# Patient Record
Sex: Male | Born: 1975 | Race: White | Hispanic: No | Smoking: Current every day smoker
Health system: Southern US, Community
[De-identification: ages and names within clinical notes are randomized; demographics above are authoritative.]

---

## 2000-12-16 ENCOUNTER — Emergency Department (HOSPITAL_COMMUNITY): Admission: EM | Admit: 2000-12-16 | Discharge: 2000-12-16 | Payer: Self-pay | Admitting: Emergency Medicine

## 2004-11-30 ENCOUNTER — Emergency Department (HOSPITAL_COMMUNITY): Admission: EM | Admit: 2004-11-30 | Discharge: 2004-11-30 | Payer: Self-pay | Admitting: Emergency Medicine

## 2005-02-07 ENCOUNTER — Emergency Department (HOSPITAL_COMMUNITY): Admission: EM | Admit: 2005-02-07 | Discharge: 2005-02-07 | Payer: Self-pay | Admitting: Emergency Medicine

## 2006-04-29 ENCOUNTER — Emergency Department (HOSPITAL_COMMUNITY): Admission: EM | Admit: 2006-04-29 | Discharge: 2006-04-29 | Payer: Self-pay | Admitting: Emergency Medicine

## 2012-05-12 ENCOUNTER — Emergency Department (HOSPITAL_COMMUNITY)
Admission: EM | Admit: 2012-05-12 | Discharge: 2012-05-13 | Disposition: A | Discharge status: Home / Self Care | Payer: Self-pay | Attending: Emergency Medicine | Admitting: Emergency Medicine

## 2012-05-12 ENCOUNTER — Encounter (HOSPITAL_COMMUNITY): Payer: Self-pay | Admitting: *Deleted

## 2012-05-12 ENCOUNTER — Emergency Department (HOSPITAL_COMMUNITY): Payer: Self-pay

## 2012-05-12 DIAGNOSIS — R209 Unspecified disturbances of skin sensation: Secondary | ICD-10-CM | POA: Insufficient documentation

## 2012-05-12 DIAGNOSIS — R509 Fever, unspecified: Secondary | ICD-10-CM | POA: Insufficient documentation

## 2012-05-12 DIAGNOSIS — K5900 Constipation, unspecified: Secondary | ICD-10-CM | POA: Insufficient documentation

## 2012-05-12 DIAGNOSIS — R110 Nausea: Secondary | ICD-10-CM | POA: Insufficient documentation

## 2012-05-12 DIAGNOSIS — F17200 Nicotine dependence, unspecified, uncomplicated: Secondary | ICD-10-CM | POA: Insufficient documentation

## 2012-05-12 LAB — CBC WITH DIFFERENTIAL/PLATELET
Basophils Absolute: 0.1 10*3/uL (ref 0.0–0.1)
Basophils Relative: 1 % (ref 0–1)
Eosinophils Relative: 5 % (ref 0–5)
HCT: 43.3 % (ref 39.0–52.0)
MCHC: 34.6 g/dL (ref 30.0–36.0)
MCV: 86.8 fL (ref 78.0–100.0)
Monocytes Absolute: 0.7 10*3/uL (ref 0.1–1.0)
Neutro Abs: 3.2 10*3/uL (ref 1.7–7.7)
RDW: 12.6 % (ref 11.5–15.5)

## 2012-05-12 MED ORDER — IOHEXOL 300 MG/ML  SOLN
50.0000 mL | Freq: Once | INTRAMUSCULAR | Status: AC | PRN
  Administered 2012-05-12: 50 mL via ORAL

## 2012-05-12 MED ORDER — MORPHINE SULFATE 4 MG/ML IJ SOLN
4.0000 mg | Freq: Once | INTRAMUSCULAR | Status: AC
  Administered 2012-05-13: 4 mg via INTRAVENOUS
  Filled 2012-05-12: qty 1

## 2012-05-12 MED ORDER — ONDANSETRON HCL 4 MG/2ML IJ SOLN
4.0000 mg | Freq: Once | INTRAMUSCULAR | Status: AC
  Administered 2012-05-12: 4 mg via INTRAVENOUS
  Filled 2012-05-12: qty 2

## 2012-05-12 NOTE — ED Notes (Signed)
Pain in right lower quadrant, intermittent numbness left hand

## 2012-05-13 LAB — URINALYSIS, ROUTINE W REFLEX MICROSCOPIC
Glucose, UA: NEGATIVE mg/dL
Hgb urine dipstick: NEGATIVE
Specific Gravity, Urine: 1.015 (ref 1.005–1.030)
Urobilinogen, UA: 0.2 mg/dL (ref 0.0–1.0)

## 2012-05-13 LAB — COMPREHENSIVE METABOLIC PANEL
AST: 24 U/L (ref 0–37)
Albumin: 4.2 g/dL (ref 3.5–5.2)
Calcium: 9.8 mg/dL (ref 8.4–10.5)
Creatinine, Ser: 0.94 mg/dL (ref 0.50–1.35)
Total Protein: 7.3 g/dL (ref 6.0–8.3)

## 2012-05-13 MED ORDER — POLYETHYLENE GLYCOL 3350 17 GM/SCOOP PO POWD: 17.0000 g | Freq: Every day | ORAL | Status: DC

## 2012-05-13 MED ORDER — MAGNESIUM CITRATE PO SOLN: ORAL | Status: DC

## 2012-05-13 MED ORDER — IOHEXOL 300 MG/ML  SOLN
100.0000 mL | Freq: Once | INTRAMUSCULAR | Status: AC | PRN
  Administered 2012-05-13: 100 mL via INTRAVENOUS

## 2012-05-13 NOTE — ED Provider Notes (Signed)
Medical screening examination/treatment/procedure(s) were performed by non-physician practitioner and as supervising physician I was immediately available for consultation/collaboration. \  Benny Lennert, MD 05/13/12 [phone]
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
## 2012-05-13 NOTE — ED Notes (Signed)
Pt alert & oriented x4, stable gait. Patient given discharge instructions, paperwork & prescription(s). Patient  instructed to stop at the registration desk to finish any additional paperwork. Patient verbalized understanding. Pt left department w/ no further questions. 

## 2016-03-23 ENCOUNTER — Ambulatory Visit (INDEPENDENT_AMBULATORY_CARE_PROVIDER_SITE_OTHER): Payer: BLUE CROSS/BLUE SHIELD | Admitting: Family

## 2016-03-23 ENCOUNTER — Encounter: Payer: Self-pay | Admitting: Family

## 2016-03-23 VITALS — BP 122/79 | HR 72 | Temp 97.1°F | Ht 70.0 in | Wt 206.0 lb

## 2016-03-23 DIAGNOSIS — E663 Overweight: Secondary | ICD-10-CM

## 2016-03-23 DIAGNOSIS — F17200 Nicotine dependence, unspecified, uncomplicated: Secondary | ICD-10-CM | POA: Diagnosis not present

## 2016-03-23 DIAGNOSIS — J209 Acute bronchitis, unspecified: Secondary | ICD-10-CM | POA: Diagnosis not present

## 2016-03-23 DIAGNOSIS — J019 Acute sinusitis, unspecified: Secondary | ICD-10-CM

## 2016-03-23 MED ORDER — AMOXICILLIN-POT CLAVULANATE 875-125 MG PO TABS: 1.0000 | ORAL_TABLET | Freq: Two times a day (BID) | ORAL | 0 refills | Status: DC

## 2016-03-23 MED ORDER — PREDNISONE 10 MG (21) PO TBPK: ORAL_TABLET | ORAL | 0 refills | Status: DC

## 2016-03-23 NOTE — Progress Notes (Signed)
Subjective:    Patient ID: Vincent Medina, male    DOB: [birth year], 41 y.o.   MRN: [id number]  Cough  This is a new problem. The current episode started 1 to 4 weeks ago. The problem has been waxing and waning. The problem occurs every few minutes. The cough is productive of sputum and productive of purulent sputum. Associated symptoms include headaches, myalgias, nasal congestion, postnasal drip, rhinorrhea, a sore throat, shortness of breath, sweats and wheezing. Pertinent negatives include no chills, ear congestion, ear pain or fever. The symptoms are aggravated by lying down. Risk factors for lung disease include smoking/tobacco exposure. He has tried rest and OTC cough suppressant for the symptoms. The treatment provided mild relief.  Headache   Associated symptoms include coughing, rhinorrhea and a sore throat. Pertinent negatives include no ear pain or fever.  Diarrhea   Associated symptoms include coughing, headaches, myalgias and sweats. Pertinent negatives include no chills or fever.      Review of Systems  Constitutional: Negative for chills and fever.  HENT: Positive for postnasal drip, rhinorrhea and sore throat. Negative for ear pain.   Respiratory: Positive for cough, shortness of breath and wheezing.   Gastrointestinal: Positive for diarrhea.  Musculoskeletal: Positive for myalgias.  Neurological: Positive for headaches.  All other systems reviewed and are negative.      Objective:   Physical Exam  Constitutional: He is oriented to person, place, and time. He appears well-developed and well-nourished. No distress.  HENT:  Head: Normocephalic.  Right Ear: External ear normal.  Left Ear: External ear normal.  Nose: Mucosal edema and rhinorrhea present. Right sinus exhibits frontal sinus tenderness. Left sinus exhibits frontal sinus tenderness.  Mouth/Throat: Posterior oropharyngeal edema and posterior oropharyngeal erythema present.  Eyes: Pupils are equal, round, and  reactive to light. Right eye exhibits no discharge. Left eye exhibits no discharge.  Neck: Normal range of motion. Neck supple. No thyromegaly present.  Cardiovascular: Normal rate, regular rhythm, normal heart sounds and intact distal pulses.   No murmur heard. Pulmonary/Chest: Effort normal. No respiratory distress. He has wheezes in the right middle field and the left upper field.  Abdominal: Soft. Bowel sounds are normal. He exhibits no distension. There is no tenderness.  Musculoskeletal: Normal range of motion. He exhibits no edema or tenderness.  Neurological: He is alert and oriented to person, place, and time. He has normal reflexes. No cranial nerve deficit.  Skin: Skin is warm and dry. No rash noted. No erythema.  Psychiatric: He has a normal mood and affect. His behavior is normal. Judgment and thought content normal.  Vitals reviewed.     BP 122/79   Pulse 72   Temp 97.1 F (36.2 C) (Oral)   Ht 5\' 10"  (1.778 m)   Wt 206 lb (93.4 kg)   BMI 29.56 kg/m      Assessment & Plan:  1. Acute bronchitis, unspecified organism - Take meds as prescribed - Use a cool mist humidifier  -Use saline nose sprays frequently -Saline irrigations of the nose can be very helpful if done frequently.  * 4X daily for 1 week*  * Use of a nettie pot can be helpful with this. Follow directions with this* -Force fluids -For any cough or congestion  Use plain Mucinex- regular strength or max strength is fine   * Children- consult with Pharmacist for dosing -For fever or aces or pains- take tylenol or ibuprofen appropriate for age and weight.  * for fevers greater  than 101 orally you may alternate ibuprofen and tylenol every  3 hours. -Throat lozenges if help - amoxicillin-clavulanate (AUGMENTIN) 875-125 MG tablet; Take 1 tablet by mouth 2 (two) times daily.  Dispense: 14 tablet; Refill: 0 - predniSONE (STERAPRED UNI-PAK 21 TAB) 10 MG (21) TBPK tablet; Use as directed  Dispense: 21 tablet;  Refill: 0  2. Acute sinusitis, recurrence not specified, unspecified location - amoxicillin-clavulanate (AUGMENTIN) 875-125 MG tablet; Take 1 tablet by mouth 2 (two) times daily.  Dispense: 14 tablet; Refill: 0 - predniSONE (STERAPRED UNI-PAK 21 TAB) 10 MG (21) TBPK tablet; Use as directed  Dispense: 21 tablet; Refill: 0  3. Overweight (BMI 25.0-29.9)  4. Current smoker Smoking cessation discused - amoxicillin-clavulanate (AUGMENTIN) 875-125 MG tablet; Take 1 tablet by mouth 2 (two) times daily.  Dispense: 14 tablet; Refill: 0   Health Maintenance reviewed Diet and exercise encouraged RTO pt encouraged to schedule CPE  Jannifer Rodneyhristy Hawks, FNP

## 2016-03-23 NOTE — Patient Instructions (Signed)

## 2016-04-11 ENCOUNTER — Ambulatory Visit (INDEPENDENT_AMBULATORY_CARE_PROVIDER_SITE_OTHER): Payer: BLUE CROSS/BLUE SHIELD | Admitting: Family

## 2016-04-11 ENCOUNTER — Encounter: Payer: Self-pay | Admitting: Family

## 2016-04-11 VITALS — BP 117/76 | HR 83 | Temp 98.2°F | Ht 70.0 in | Wt 204.2 lb

## 2016-04-11 DIAGNOSIS — J209 Acute bronchitis, unspecified: Secondary | ICD-10-CM

## 2016-04-11 DIAGNOSIS — F17200 Nicotine dependence, unspecified, uncomplicated: Secondary | ICD-10-CM | POA: Diagnosis not present

## 2016-04-11 MED ORDER — ALBUTEROL SULFATE HFA 108 (90 BASE) MCG/ACT IN AERS: 2.0000 | INHALATION_SPRAY | Freq: Four times a day (QID) | RESPIRATORY_TRACT | 2 refills | Status: DC | PRN

## 2016-04-11 MED ORDER — LEVOFLOXACIN 500 MG PO TABS: 500.0000 mg | ORAL_TABLET | Freq: Every day | ORAL | 0 refills | Status: DC

## 2016-04-11 MED ORDER — PREDNISONE 10 MG (21) PO TBPK: ORAL_TABLET | ORAL | 0 refills | Status: DC

## 2016-04-11 NOTE — Patient Instructions (Signed)

## 2016-04-11 NOTE — Progress Notes (Signed)
Subjective:    Patient ID: Vincent Medina, male    DOB: 01-May-1975, 41 y.o.   MRN: [id number]  Cough  This is a recurrent problem. The current episode started 1 to 4 weeks ago. The problem has been waxing and waning. The problem occurs every few minutes. The cough is productive of sputum. Associated symptoms include headaches, nasal congestion, postnasal drip, rhinorrhea, a sore throat, shortness of breath, sweats and wheezing. Pertinent negatives include no chills, ear congestion, ear pain, fever or myalgias. The symptoms are aggravated by lying down. Risk factors for lung disease include smoking/tobacco exposure. He has tried rest and oral steroids (augmentin) for the symptoms. The treatment provided mild relief. There is no history of asthma or COPD.      Review of Systems  Constitutional: Negative for chills and fever.  HENT: Positive for postnasal drip, rhinorrhea and sore throat. Negative for ear pain.   Respiratory: Positive for cough, shortness of breath and wheezing.   Musculoskeletal: Negative for myalgias.  Neurological: Positive for headaches.  All other systems reviewed and are negative.      Objective:   Physical Exam  Constitutional: He is oriented to person, place, and time. He appears well-developed and well-nourished. No distress.  HENT:  Head: Normocephalic.  Right Ear: External ear normal. Tympanic membrane is erythematous.  Left Ear: External ear normal. Tympanic membrane is erythematous.  Nose: Mucosal edema and rhinorrhea present.  Mouth/Throat: Posterior oropharyngeal erythema present.  Eyes: Pupils are equal, round, and reactive to light. Right eye exhibits no discharge. Left eye exhibits no discharge.  Neck: Normal range of motion. Neck supple. No thyromegaly present.  Cardiovascular: Normal rate, regular rhythm, normal heart sounds and intact distal pulses.   No murmur heard. Pulmonary/Chest: Effort normal. No respiratory distress. He has wheezes.    Abdominal: Soft. Bowel sounds are normal. He exhibits no distension. There is no tenderness.  Musculoskeletal: Normal range of motion. He exhibits no edema or tenderness.  Neurological: He is alert and oriented to person, place, and time. He has normal reflexes. No cranial nerve deficit.  Skin: Skin is warm and dry. No rash noted. No erythema.  Psychiatric: He has a normal mood and affect. His behavior is normal. Judgment and thought content normal.  Vitals reviewed.     BP 117/76   Pulse 83   Temp 98.2 F (36.8 C) (Oral)   Ht  (1.778 m)   Wt 204 lb 3.2 oz (92.6 kg)   BMI 29.30 kg/m      Assessment & Plan:  1. Current smoker -Smoking cessation - albuterol (PROVENTIL HFA;VENTOLIN HFA) 108 (90 Base) MCG/ACT inhaler; Inhale 2 puffs into the lungs every 6 (six) hours as needed for wheezing or shortness of breath.  Dispense: 1 Inhaler; Refill: 2  2. Acute bronchitis, unspecified organism - Take meds as prescribed - Use a cool mist humidifier  -Use saline nose sprays frequently -Saline irrigations of the nose can be very helpful if done frequently.  * 4X daily for 1 week*  * Use of a nettie pot can be helpful with this. Follow directions with this* -Force fluids -For any cough or congestion  Use plain Mucinex- regular strength or max strength is fine   * Children- consult with Pharmacist for dosing -For fever or aces or pains- take tylenol or ibuprofen appropriate for age and weight.  * for fevers greater than 101 orally you may alternate ibuprofen and tylenol every  3 hours. -Throat lozenges if help -  levofloxacin (LEVAQUIN) 500 MG tablet; Take 1 tablet (500 mg total) by mouth daily.  Dispense: 7 tablet; Refill: 0 - predniSONE (STERAPRED UNI-PAK 21 TAB) 10 MG (21) TBPK tablet; Use as directed  Dispense: 21 tablet; Refill: 0 - albuterol (PROVENTIL HFA;VENTOLIN HFA) 108 (90 Base) MCG/ACT inhaler; Inhale 2 puffs into the lungs every 6 (six) hours as needed for wheezing or  shortness of breath.  Dispense: 1 Inhaler; Refill: 2   Jannifer Rodney, FNP

## 2016-04-18 ENCOUNTER — Telehealth: Payer: Self-pay | Admitting: Family

## 2016-04-18 MED ORDER — NYSTATIN 100000 UNIT/ML MT SUSP: 5.0000 mL | Freq: Four times a day (QID) | OROMUCOSAL | 0 refills | Status: DC

## 2016-04-18 NOTE — Telephone Encounter (Signed)
Nystatin oral solution Prescription sent to pharmacy

## 2016-04-18 NOTE — Telephone Encounter (Signed)
What symptoms do you have? White stuff on tongue and back of throat, he has been seen by [facility] for bronchitis  How long have you been sick? Since yesterday  Have you been seen for this problem? For bronchitis was prescribed antibiotic and now thinks he has thrush  If your provider decides to give you a prescription, which pharmacy would you like for it to be sent to? [facility]   Patient informed that this information will be sent to the clinical staff for review and that they should receive a follow up call.

## 2016-04-18 NOTE — Telephone Encounter (Signed)
Patient aware that Nystatin has been sent to the pharmacy

## 2016-04-23 ENCOUNTER — Telehealth: Payer: Self-pay | Admitting: Family

## 2016-04-23 NOTE — Telephone Encounter (Signed)
Informed pt that Rx has 2 RFs to check with pharmacy to see if he can get RF, insurance may not pay for one so soon but he maybe able to pay out of pocket

## 2016-04-23 NOTE — Telephone Encounter (Signed)
What is the name of the medication? Inhaler,  Have you contacted your pharmacy to request a refill? No   Which pharmacy would you like this sent to? [facility].   Patient notified that their request is being sent to the clinical staff for review and that they should receive a call once it is complete. If they do not receive a call within 24 hours they can check with their pharmacy or our office.

## 2016-04-30 ENCOUNTER — Telehealth: Payer: Self-pay | Admitting: Family

## 2016-04-30 NOTE — Telephone Encounter (Signed)
Pt will need to be seen. He needs a chest x-ray.

## 2016-04-30 NOTE — Telephone Encounter (Signed)
Patient aware that he needs to be seen and appointment given for 8:10 in the morning.

## 2016-04-30 NOTE — Telephone Encounter (Signed)
Symptoms still include  Really bad cough Congestion - rattling - productive No fever Some nasal drainage  Finished levaquin and prednisone  This was 2nd round of antibiotic = would prefer not to come back in due to cost, but will do whatever you say.

## 2016-05-01 ENCOUNTER — Ambulatory Visit (INDEPENDENT_AMBULATORY_CARE_PROVIDER_SITE_OTHER): Payer: BLUE CROSS/BLUE SHIELD | Admitting: Family

## 2016-05-01 ENCOUNTER — Ambulatory Visit (INDEPENDENT_AMBULATORY_CARE_PROVIDER_SITE_OTHER): Payer: BLUE CROSS/BLUE SHIELD

## 2016-05-01 ENCOUNTER — Encounter: Payer: Self-pay | Admitting: Family

## 2016-05-01 VITALS — BP 132/87 | HR 66 | Temp 97.2°F | Ht 70.0 in | Wt 213.0 lb

## 2016-05-01 DIAGNOSIS — F17200 Nicotine dependence, unspecified, uncomplicated: Secondary | ICD-10-CM | POA: Diagnosis not present

## 2016-05-01 DIAGNOSIS — J209 Acute bronchitis, unspecified: Secondary | ICD-10-CM | POA: Diagnosis not present

## 2016-05-01 DIAGNOSIS — R05 Cough: Secondary | ICD-10-CM | POA: Diagnosis not present

## 2016-05-01 DIAGNOSIS — R062 Wheezing: Secondary | ICD-10-CM

## 2016-05-01 DIAGNOSIS — R059 Cough, unspecified: Secondary | ICD-10-CM

## 2016-05-01 MED ORDER — ALBUTEROL SULFATE (2.5 MG/3ML) 0.083% IN NEBU: 2.5000 mg | INHALATION_SOLUTION | Freq: Four times a day (QID) | RESPIRATORY_TRACT | 1 refills | Status: DC | PRN

## 2016-05-01 MED ORDER — PREDNISONE 20 MG PO TABS: ORAL_TABLET | ORAL | 0 refills | Status: DC

## 2016-05-01 MED ORDER — LEVOFLOXACIN 500 MG PO TABS: 500.0000 mg | ORAL_TABLET | Freq: Every day | ORAL | 0 refills | Status: DC

## 2016-05-01 NOTE — Progress Notes (Signed)
Subjective:    Patient ID: Vincent Medina, male    DOB: 02/23/1975, 41 y.o.   MRN: [id number]  Cough  The current episode started 1 to 4 weeks ago. The problem has been waxing and waning. The problem occurs every few minutes. The cough is productive of sputum. Associated symptoms include chills, a fever, headaches, postnasal drip, rhinorrhea, shortness of breath and wheezing. Pertinent negatives include no ear congestion, ear pain or nasal congestion. Risk factors for lung disease include smoking/tobacco exposure. He has tried rest and OTC cough suppressant (antibiotics ) for the symptoms. The treatment provided moderate relief. There is no history of asthma or COPD.  Headache   Associated symptoms include coughing, dizziness, a fever and rhinorrhea. Pertinent negatives include no ear pain.  Dizziness  Associated symptoms include chills, coughing, a fever and headaches.      Review of Systems  Constitutional: Positive for chills and fever.  HENT: Positive for postnasal drip and rhinorrhea. Negative for ear pain.   Respiratory: Positive for cough, shortness of breath and wheezing.   Neurological: Positive for dizziness and headaches.  All other systems reviewed and are negative.      Objective:   Physical Exam  Constitutional: He is oriented to person, place, and time. He appears well-developed and well-nourished. No distress.  HENT:  Head: Normocephalic.  Right Ear: External ear normal. Tympanic membrane is erythematous.  Left Ear: External ear normal. Tympanic membrane is erythematous.  Nose: Mucosal edema and rhinorrhea present.  Mouth/Throat: Posterior oropharyngeal erythema present.  Eyes: Pupils are equal, round, and reactive to light. Right eye exhibits no discharge. Left eye exhibits no discharge.  Neck: Normal range of motion. Neck supple. No thyromegaly present.  Cardiovascular: Normal rate, regular rhythm, normal heart sounds and intact distal pulses.   No murmur  heard. Pulmonary/Chest: Effort normal. No respiratory distress. He has wheezes.  Abdominal: Soft. Bowel sounds are normal. He exhibits no distension. There is no tenderness.  Musculoskeletal: Normal range of motion. He exhibits no edema or tenderness.  Neurological: He is alert and oriented to person, place, and time. He has normal reflexes. No cranial nerve deficit.  Skin: Skin is warm and dry. No rash noted. No erythema.  Psychiatric: He has a normal mood and affect. His behavior is normal. Judgment and thought content normal.  Vitals reviewed.   X-ray- negative Preliminary reading by Jannifer Rodney, FNP [facility]   BP 132/87   Pulse 66   Temp 97.2 F (36.2 C) (Oral)   Ht  (1.778 m)   Wt 213 lb (96.6 kg)   BMI 30.56 kg/m      Assessment & Plan:  1. Cough - DG Chest 2 View; Future  2. Acute bronchitis, unspecified organism - Take meds as prescribed - Use a cool mist humidifier  -Use saline nose sprays frequently -Saline irrigations of the nose can be very helpful if done frequently.  * 4X daily for 1 week*  * Use of a nettie pot can be helpful with this. Follow directions with this* -Force fluids -For any cough or congestion  Use plain Mucinex- regular strength or max strength is fine   * Children- consult with Pharmacist for dosing -For fever or aces or pains- take tylenol or ibuprofen appropriate for age and weight.  * for fevers greater than 101 orally you may alternate ibuprofen and tylenol every  3 hours. -Throat lozenges if help - predniSONE (DELTASONE) 20 MG tablet; Take 3 tabs daily for 1 week,  then 2 tabs for 1 week, then 1 tab for one week  Dispense: 42 tablet; Refill: 0 - albuterol (PROVENTIL) (2.5 MG/3ML) 0.083% nebulizer solution; Take 3 mLs (2.5 mg total) by nebulization every 6 (six) hours as needed for wheezing or shortness of breath.  Dispense: 150 mL; Refill: 1 - DME Nebulizer machine - levofloxacin (LEVAQUIN) 500 MG tablet; Take 1 tablet (500 mg total)  by mouth daily.  Dispense: 7 tablet; Refill: 0  3. Wheezing - albuterol (PROVENTIL) (2.5 MG/3ML) 0.083% nebulizer solution; Take 3 mLs (2.5 mg total) by nebulization every 6 (six) hours as needed for wheezing or shortness of breath.  Dispense: 150 mL; Refill: 1 - DME Nebulizer machine  4. Smoker -Smoking cessation discussed   Jannifer Rodney, FNP

## 2016-05-01 NOTE — Patient Instructions (Signed)

## 2016-07-16 DIAGNOSIS — S15101A Unspecified injury of right vertebral artery, initial encounter: Secondary | ICD-10-CM | POA: Insufficient documentation

## 2016-07-16 DIAGNOSIS — I639 Cerebral infarction, unspecified: Secondary | ICD-10-CM

## 2016-07-16 DIAGNOSIS — S32039A Unspecified fracture of third lumbar vertebra, initial encounter for closed fracture: Secondary | ICD-10-CM | POA: Insufficient documentation

## 2016-07-16 DIAGNOSIS — S32049A Unspecified fracture of fourth lumbar vertebra, initial encounter for closed fracture: Secondary | ICD-10-CM | POA: Insufficient documentation

## 2016-07-16 DIAGNOSIS — S0300XD Dislocation of jaw, unspecified side, subsequent encounter: Secondary | ICD-10-CM | POA: Insufficient documentation

## 2016-07-16 DIAGNOSIS — S42001A Fracture of unspecified part of right clavicle, initial encounter for closed fracture: Secondary | ICD-10-CM | POA: Insufficient documentation

## 2016-07-16 HISTORY — DX: Cerebral infarction, unspecified: I63.9

## 2016-08-03 ENCOUNTER — Other Ambulatory Visit (HOSPITAL_COMMUNITY): Payer: Self-pay

## 2016-08-03 ENCOUNTER — Inpatient Hospital Stay
Admission: AD | Admit: 2016-08-03 | Discharge: 2016-08-15 | Disposition: A | Discharge status: Skilled Nursing Facility | Payer: Self-pay | Source: Ambulatory Visit | Attending: Internal Medicine | Admitting: Internal Medicine

## 2016-08-03 DIAGNOSIS — Z0189 Encounter for other specified special examinations: Secondary | ICD-10-CM

## 2016-08-04 LAB — CBC WITH DIFFERENTIAL/PLATELET
BASOS PCT: 1 %
Basophils Absolute: 0 10*3/uL (ref 0.0–0.1)
EOS ABS: 0.2 10*3/uL (ref 0.0–0.7)
EOS PCT: 2 %
HCT: 37.5 % — ABNORMAL LOW (ref 39.0–52.0)
Hemoglobin: 12 g/dL — ABNORMAL LOW (ref 13.0–17.0)
LYMPHS PCT: 20 %
Lymphs Abs: 1.6 10*3/uL (ref 0.7–4.0)
MCH: 30 pg (ref 26.0–34.0)
MCHC: 32 g/dL (ref 30.0–36.0)
MCV: 93.8 fL (ref 78.0–100.0)
MONO ABS: 0.6 10*3/uL (ref 0.1–1.0)
Monocytes Relative: 7 %
NEUTROS ABS: 5.8 10*3/uL (ref 1.7–7.7)
Neutrophils Relative %: 70 %
PLATELETS: 445 10*3/uL — AB (ref 150–400)
RBC: 4 MIL/uL — ABNORMAL LOW (ref 4.22–5.81)
RDW: 13.6 % (ref 11.5–15.5)
WBC: 8.2 10*3/uL (ref 4.0–10.5)

## 2016-08-04 LAB — COMPREHENSIVE METABOLIC PANEL
ALBUMIN: 3.4 g/dL — AB (ref 3.5–5.0)
ALT: 154 U/L — ABNORMAL HIGH (ref 17–63)
ANION GAP: 10 (ref 5–15)
AST: 59 U/L — AB (ref 15–41)
Alkaline Phosphatase: 132 U/L — ABNORMAL HIGH (ref 38–126)
BUN: 21 mg/dL — ABNORMAL HIGH (ref 6–20)
CHLORIDE: 108 mmol/L (ref 101–111)
CO2: 26 mmol/L (ref 22–32)
Calcium: 9.4 mg/dL (ref 8.9–10.3)
Creatinine, Ser: 0.95 mg/dL (ref 0.61–1.24)
GFR calc Af Amer: >60 mL/min (ref >60)
GFR calc non Af Amer: >60 mL/min (ref >60)
GLUCOSE: 101 mg/dL — AB (ref 65–99)
POTASSIUM: 3.7 mmol/L (ref 3.5–5.1)
SODIUM: 144 mmol/L (ref 135–145)
Total Bilirubin: 0.7 mg/dL (ref 0.3–1.2)
Total Protein: 6.8 g/dL (ref 6.5–8.1)

## 2016-08-08 LAB — CBC WITH DIFFERENTIAL/PLATELET
BASOS ABS: 0 10*3/uL (ref 0.0–0.1)
BASOS PCT: 1 %
Eosinophils Absolute: 0.2 10*3/uL (ref 0.0–0.7)
Eosinophils Relative: 3 %
HEMATOCRIT: 42.1 % (ref 39.0–52.0)
Hemoglobin: 13.7 g/dL (ref 13.0–17.0)
LYMPHS ABS: 1.8 10*3/uL (ref 0.7–4.0)
LYMPHS PCT: 27 %
MCH: 29.9 pg (ref 26.0–34.0)
MCHC: 32.5 g/dL (ref 30.0–36.0)
MCV: 91.9 fL (ref 78.0–100.0)
MONOS PCT: 9 %
Monocytes Absolute: 0.6 10*3/uL (ref 0.1–1.0)
NEUTROS PCT: 60 %
Neutro Abs: 3.9 10*3/uL (ref 1.7–7.7)
PLATELETS: 354 10*3/uL (ref 150–400)
RBC: 4.58 MIL/uL (ref 4.22–5.81)
RDW: 13.6 % (ref 11.5–15.5)
WBC: 6.5 10*3/uL (ref 4.0–10.5)

## 2016-08-08 LAB — BASIC METABOLIC PANEL
ANION GAP: 10 (ref 5–15)
BUN: 14 mg/dL (ref 6–20)
CALCIUM: 9.3 mg/dL (ref 8.9–10.3)
CO2: 26 mmol/L (ref 22–32)
Chloride: 104 mmol/L (ref 101–111)
Creatinine, Ser: 0.8 mg/dL (ref 0.61–1.24)
Glucose, Bld: 89 mg/dL (ref 65–99)
Potassium: 4 mmol/L (ref 3.5–5.1)
Sodium: 140 mmol/L (ref 135–145)

## 2016-08-08 LAB — MAGNESIUM: Magnesium: 2.3 mg/dL (ref 1.7–2.4)

## 2016-08-08 LAB — PHOSPHORUS: PHOSPHORUS: 3.9 mg/dL (ref 2.5–4.6)

## 2016-08-10 ENCOUNTER — Other Ambulatory Visit (HOSPITAL_COMMUNITY): Payer: Self-pay

## 2016-08-30 ENCOUNTER — Ambulatory Visit (INDEPENDENT_AMBULATORY_CARE_PROVIDER_SITE_OTHER): Payer: BLUE CROSS/BLUE SHIELD | Admitting: Family

## 2016-08-30 ENCOUNTER — Encounter: Payer: Self-pay | Admitting: Family

## 2016-08-30 VITALS — BP 118/86 | HR 105 | Temp 97.1°F | Ht 70.0 in | Wt 193.0 lb

## 2016-08-30 DIAGNOSIS — Z09 Encounter for follow-up examination after completed treatment for conditions other than malignant neoplasm: Secondary | ICD-10-CM | POA: Diagnosis not present

## 2016-08-30 DIAGNOSIS — S42009S Fracture of unspecified part of unspecified clavicle, sequela: Secondary | ICD-10-CM | POA: Diagnosis not present

## 2016-08-30 DIAGNOSIS — F321 Major depressive disorder, single episode, moderate: Secondary | ICD-10-CM

## 2016-08-30 DIAGNOSIS — S32009A Unspecified fracture of unspecified lumbar vertebra, initial encounter for closed fracture: Secondary | ICD-10-CM

## 2016-08-30 DIAGNOSIS — E663 Overweight: Secondary | ICD-10-CM | POA: Diagnosis not present

## 2016-08-30 DIAGNOSIS — S0292XS Unspecified fracture of facial bones, sequela: Secondary | ICD-10-CM | POA: Diagnosis not present

## 2016-08-30 DIAGNOSIS — G4700 Insomnia, unspecified: Secondary | ICD-10-CM

## 2016-08-30 LAB — CBC WITH DIFFERENTIAL/PLATELET
Basophils Absolute: 0.1 10*3/uL (ref 0.0–0.2)
Basos: 1 %
EOS (ABSOLUTE): 0.5 10*3/uL — ABNORMAL HIGH (ref 0.0–0.4)
Eos: 7 %
Hematocrit: 41.4 % (ref 37.5–51.0)
Hemoglobin: 13.6 g/dL (ref 13.0–17.7)
Immature Grans (Abs): 0 10*3/uL (ref 0.0–0.1)
Immature Granulocytes: 0 %
Lymphocytes Absolute: 2 10*3/uL (ref 0.7–3.1)
Lymphs: 29 %
MCH: 29.6 pg (ref 26.6–33.0)
MCHC: 32.9 g/dL (ref 31.5–35.7)
MCV: 90 fL (ref 79–97)
Monocytes Absolute: 0.6 10*3/uL (ref 0.1–0.9)
Monocytes: 8 %
Neutrophils Absolute: 3.7 10*3/uL (ref 1.4–7.0)
Neutrophils: 55 %
Platelets: 278 10*3/uL (ref 150–379)
RBC: 4.59 x10E6/uL (ref 4.14–5.80)
RDW: 13.8 % (ref 12.3–15.4)
WBC: 6.8 10*3/uL (ref 3.4–10.8)

## 2016-08-30 LAB — CMP14+EGFR
ALT: 18 [IU]/L (ref 0–44)
AST: 14 [IU]/L (ref 0–40)
Albumin/Globulin Ratio: 2 (ref 1.2–2.2)
Albumin: 4.3 g/dL (ref 3.5–5.5)
Alkaline Phosphatase: 96 [IU]/L (ref 39–117)
BUN/Creatinine Ratio: 2 — ABNORMAL LOW (ref 9–20)
BUN: 2 mg/dL — ABNORMAL LOW (ref 6–20)
Bilirubin Total: <0.2 mg/dL (ref 0.0–1.2)
CO2: 20 mmol/L (ref 20–29)
Calcium: 9.3 mg/dL (ref 8.7–10.2)
Chloride: 108 mmol/L — ABNORMAL HIGH (ref 96–106)
Creatinine, Ser: 0.99 mg/dL (ref 0.76–1.27)
GFR calc Af Amer: 110 mL/min/{1.73_m2}
GFR calc non Af Amer: 96 mL/min/{1.73_m2}
Globulin, Total: 2.2 g/dL (ref 1.5–4.5)
Glucose: 83 mg/dL (ref 65–99)
Potassium: 4 mmol/L (ref 3.5–5.2)
Sodium: 145 mmol/L — ABNORMAL HIGH (ref 134–144)
Total Protein: 6.5 g/dL (ref 6.0–8.5)

## 2016-08-30 MED ORDER — MIRTAZAPINE 30 MG PO TABS: 30.0000 mg | ORAL_TABLET | Freq: Every day | ORAL | 1 refills | Status: DC

## 2016-08-30 MED ORDER — OXYCODONE-ACETAMINOPHEN 5-325 MG PO TABS: 1.0000 | ORAL_TABLET | Freq: Two times a day (BID) | ORAL | 0 refills | Status: DC | PRN

## 2016-08-30 NOTE — Patient Instructions (Signed)
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
## 2016-09-07 ENCOUNTER — Telehealth: Payer: Self-pay | Admitting: Family

## 2016-09-07 NOTE — Telephone Encounter (Signed)
Patients feet or swelling denies change in color or heat to them, states they do hurt a little. Has been walking a lot on them. Patient states the pain med's are not really working for his pain is wanting some kind of cream for pain. Please advise.

## 2016-09-07 NOTE — Telephone Encounter (Signed)
This will need an appointment, cannot determine the cause and treatment needed.

## 2016-09-07 NOTE — Telephone Encounter (Signed)
Yetta BarreJones - can you address as coverage for [employer]

## 2016-09-07 NOTE — Telephone Encounter (Signed)
Aware and appointment scheduled.

## 2016-09-13 ENCOUNTER — Ambulatory Visit (INDEPENDENT_AMBULATORY_CARE_PROVIDER_SITE_OTHER): Payer: BLUE CROSS/BLUE SHIELD | Admitting: Family

## 2016-09-13 ENCOUNTER — Encounter: Payer: Self-pay | Admitting: Family

## 2016-09-13 VITALS — BP 120/78 | HR 82 | Temp 97.2°F | Ht 70.0 in | Wt 195.6 lb

## 2016-09-13 DIAGNOSIS — S0292XA Unspecified fracture of facial bones, initial encounter for closed fracture: Secondary | ICD-10-CM | POA: Insufficient documentation

## 2016-09-13 DIAGNOSIS — S0292XS Unspecified fracture of facial bones, sequela: Secondary | ICD-10-CM | POA: Diagnosis not present

## 2016-09-13 DIAGNOSIS — F411 Generalized anxiety disorder: Secondary | ICD-10-CM | POA: Diagnosis not present

## 2016-09-13 DIAGNOSIS — M7989 Other specified soft tissue disorders: Secondary | ICD-10-CM | POA: Diagnosis not present

## 2016-09-13 DIAGNOSIS — F17200 Nicotine dependence, unspecified, uncomplicated: Secondary | ICD-10-CM

## 2016-09-13 DIAGNOSIS — S32009A Unspecified fracture of unspecified lumbar vertebra, initial encounter for closed fracture: Secondary | ICD-10-CM | POA: Diagnosis not present

## 2016-09-13 DIAGNOSIS — S42009S Fracture of unspecified part of unspecified clavicle, sequela: Secondary | ICD-10-CM

## 2016-09-13 DIAGNOSIS — F321 Major depressive disorder, single episode, moderate: Secondary | ICD-10-CM | POA: Diagnosis not present

## 2016-09-13 MED ORDER — DULOXETINE HCL 30 MG PO CPEP: 30.0000 mg | ORAL_CAPSULE | Freq: Every day | ORAL | 3 refills | Status: DC

## 2016-09-13 MED ORDER — DICLOFENAC SODIUM 75 MG PO TBEC
75.0000 mg | DELAYED_RELEASE_TABLET | Freq: Two times a day (BID) | ORAL | 0 refills | Status: DC
Start: 2016-09-13 — End: 2016-09-26

## 2016-09-13 MED ORDER — OXYCODONE-ACETAMINOPHEN 5-325 MG PO TABS: 1.0000 | ORAL_TABLET | Freq: Two times a day (BID) | ORAL | 0 refills | Status: DC | PRN

## 2016-09-13 MED ORDER — CYCLOBENZAPRINE HCL 10 MG PO TABS: 10.0000 mg | ORAL_TABLET | Freq: Three times a day (TID) | ORAL | 0 refills | Status: DC | PRN

## 2016-09-13 MED ORDER — DICLOFENAC SODIUM 1 % TD GEL: 2.0000 g | Freq: Four times a day (QID) | TRANSDERMAL | 1 refills | Status: DC
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
## 2016-09-13 NOTE — Addendum Note (Signed)
Addended by: Jannifer RodneyHAWKS, Nikya Busler A on: 09/13/2016 02:26 PM   Modules accepted: Orders

## 2016-09-13 NOTE — Patient Instructions (Signed)

## 2016-09-13 NOTE — Addendum Note (Signed)
Addended by: Jannifer RodneyHAWKS, CHRISTY A on: 09/13/2016 02:36 PM   Modules accepted: Orders

## 2016-09-14 ENCOUNTER — Ambulatory Visit (HOSPITAL_COMMUNITY)
Admission: RE | Admit: 2016-09-14 | Discharge: 2016-09-14 | Disposition: A | Discharge status: Home / Self Care | Payer: BLUE CROSS/BLUE SHIELD | Source: Ambulatory Visit | Attending: Family | Admitting: Family

## 2016-09-14 DIAGNOSIS — F17200 Nicotine dependence, unspecified, uncomplicated: Secondary | ICD-10-CM | POA: Insufficient documentation

## 2016-09-14 DIAGNOSIS — M7989 Other specified soft tissue disorders: Secondary | ICD-10-CM | POA: Insufficient documentation

## 2016-09-17 ENCOUNTER — Ambulatory Visit: Payer: BLUE CROSS/BLUE SHIELD | Attending: Family | Admitting: Physical Therapy

## 2016-09-17 ENCOUNTER — Encounter: Payer: Self-pay | Admitting: Physical Therapy

## 2016-09-17 VITALS — BP 99/71 | HR 86

## 2016-09-17 DIAGNOSIS — M6281 Muscle weakness (generalized): Secondary | ICD-10-CM

## 2016-09-17 DIAGNOSIS — R2681 Unsteadiness on feet: Secondary | ICD-10-CM | POA: Diagnosis present
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
## 2016-09-25 ENCOUNTER — Ambulatory Visit: Payer: BLUE CROSS/BLUE SHIELD | Admitting: *Deleted

## 2016-09-25 DIAGNOSIS — R2681 Unsteadiness on feet: Secondary | ICD-10-CM

## 2016-09-25 DIAGNOSIS — M6281 Muscle weakness (generalized): Secondary | ICD-10-CM
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
## 2016-09-26 ENCOUNTER — Ambulatory Visit: Payer: BLUE CROSS/BLUE SHIELD | Admitting: Physical Therapy

## 2016-09-26 ENCOUNTER — Ambulatory Visit (INDEPENDENT_AMBULATORY_CARE_PROVIDER_SITE_OTHER): Payer: BLUE CROSS/BLUE SHIELD | Admitting: Family

## 2016-09-26 ENCOUNTER — Encounter: Payer: Self-pay | Admitting: Family

## 2016-09-26 VITALS — BP 121/85 | HR 94 | Temp 97.5°F | Ht 70.0 in | Wt 200.8 lb

## 2016-09-26 DIAGNOSIS — R609 Edema, unspecified: Secondary | ICD-10-CM | POA: Diagnosis not present

## 2016-09-26 DIAGNOSIS — S0292XS Unspecified fracture of facial bones, sequela: Secondary | ICD-10-CM | POA: Diagnosis not present

## 2016-09-26 DIAGNOSIS — F321 Major depressive disorder, single episode, moderate: Secondary | ICD-10-CM | POA: Diagnosis not present

## 2016-09-26 DIAGNOSIS — S32009A Unspecified fracture of unspecified lumbar vertebra, initial encounter for closed fracture: Secondary | ICD-10-CM

## 2016-09-26 DIAGNOSIS — R2681 Unsteadiness on feet: Secondary | ICD-10-CM

## 2016-09-26 DIAGNOSIS — M6281 Muscle weakness (generalized): Secondary | ICD-10-CM | POA: Diagnosis not present

## 2016-09-26 DIAGNOSIS — S42009S Fracture of unspecified part of unspecified clavicle, sequela: Secondary | ICD-10-CM

## 2016-09-26 DIAGNOSIS — F411 Generalized anxiety disorder: Secondary | ICD-10-CM

## 2016-09-26 MED ORDER — DICLOFENAC SODIUM 75 MG PO TBEC: 75.0000 mg | DELAYED_RELEASE_TABLET | Freq: Two times a day (BID) | ORAL | 0 refills | Status: DC

## 2016-09-26 MED ORDER — DULOXETINE HCL 60 MG PO CPEP: 60.0000 mg | ORAL_CAPSULE | Freq: Every day | ORAL | 1 refills | Status: DC

## 2016-09-26 MED ORDER — CYCLOBENZAPRINE HCL 10 MG PO TABS: 10.0000 mg | ORAL_TABLET | Freq: Three times a day (TID) | ORAL | 0 refills | Status: DC | PRN

## 2016-09-26 MED ORDER — DICLOFENAC SODIUM 1 % TD GEL: 2.0000 g | Freq: Four times a day (QID) | TRANSDERMAL | 1 refills | Status: DC

## 2016-09-26 NOTE — Therapy (Addendum)
[facility] [address] [place], [place], [address] Phone: [phone]   Fax:  [phone]  Physical Therapy Treatment  Patient Details  Name: Vincent Medina MRN: [id number] Date of Birth: Dec 25, 1975 Referring Provider: Jannifer Rodney  Encounter Date: 09/26/2016      PT End of Session - 09/26/16 1004    Visit Number 3   Number of Visits 16   Date for PT Re-Evaluation 11/05/16   PT Start Time 0900   PT Stop Time 0951   PT Time Calculation (min) 51 min   Activity Tolerance Patient tolerated treatment well   Behavior During Therapy [facility] for tasks assessed/performed      No past medical history on file.  No past surgical history on file.  There were no vitals filed for this visit.      Subjective Assessment - 09/26/16 1007    Subjective I was sore after that last treatment.  I'm doing home exercises.   Pertinent History Patient report:  CVA.   How long can you walk comfortably? Short distances outside with straight cane.   Pain Score 7    Pain Location Shoulder   Pain Orientation Right;Left   Pain Descriptors / Indicators Aching   Pain Type Acute pain   Pain Onset More than a month ago                         [facility] Adult PT Treatment/Exercise - 09/26/16 0001      Exercises   Exercises Shoulder;Elbow;Knee/Hip     Elbow Exercises   Other elbow exercises 5# bicep curls; hammer curls; tricep kick backs 3 sets to fatigue; Bodyblade lite x 1 minutes.  Green XTS shoulder extension and scapular retraction 3 sets to fatigue.     Knee/Hip Exercises: Aerobic   Nustep level 5 x 20 minutes.     Shoulder Exercises: ROM/Strengthening   UBE (Upper Arm Bike) 8 minutes at 90 RPM's (4 minutes forward and 4 minutes backward).                PT Education - 09/26/16 1013    Education provided Yes   Education Details Red theraband and red putty for HEP.   Person(s) Educated Patient   Methods  Explanation;Demonstration   Comprehension Verbalized understanding;Returned demonstration             PT Long Term Goals - 09/17/16 1225      PT LONG TERM GOAL #1   Title Independent with a HEP.   Time 8   Period Weeks   Status New     PT LONG TERM GOAL #2   Title Restore full left UE AROM.   Time 8   Period Weeks   Status New     PT LONG TERM GOAL #3   Title Normal left U and LE strength.   Time 8   Period Weeks   Status New     PT LONG TERM GOAL #4   Title Walk in clinic 500 feet without assistive device without LOB.   Time 8   Period Weeks   Status New     PT LONG TERM GOAL #5   Title Perform ADL's independently.   Time 8   Period Weeks   Status New               Plan - 09/26/16 1016    Clinical Impression Statement Patient highly motivated to improve.  Left grip  remains that same as initial value.  Patient provided with red theraputty to performing gripping exercises at home.      Patient will benefit from skilled therapeutic intervention in order to improve the following deficits and impairments:     Visit Diagnosis: Muscle weakness (generalized)  Unsteadiness on feet     Problem List Patient Active Problem List   Diagnosis Date Noted  . Depression, major, single episode, moderate (HCC) 09/13/2016  . GAD (generalized anxiety disorder) 09/13/2016  . Facial bones, closed fracture (HCC) 09/13/2016  . Dislocated mandible, subsequent encounter 07/16/2016  . Fracture of right clavicle 07/16/2016  . Injury of right vertebral artery 07/16/2016  . L3 vertebral fracture (HCC) 07/16/2016  . L4 vertebral fracture (HCC) 07/16/2016  . Overweight (BMI 25.0-29.9) 03/23/2016  . Current smoker 03/23/2016    Crystalann Korf, [place] MPT 09/26/2016, 10:17 AM  [facility] [address] [place], [place], [address] Phone: [phone]   Fax:  [phone]  Name: Vincent Medina MRN: [id number] Date of Birth:  24-Nov-1975

## 2016-09-26 NOTE — Progress Notes (Signed)
Subjective:    Patient ID: Vincent Medina, male    DOB: 21-Dec-1975, 41 y.o.   MRN: [id number]  Back Pain  This is a recurrent problem. The current episode started more than 1 month ago. The problem occurs intermittently. The problem has been waxing and waning since onset. The pain is present in the lumbar spine. The quality of the pain is described as aching. The pain is moderate. Associated symptoms include leg pain. Pertinent negatives include no bladder incontinence or bowel incontinence. He has tried analgesics, bed rest, muscle relaxant and NSAIDs for the symptoms.  Anxiety  Presents for follow-up visit. Symptoms include excessive worry, irritability, nervous/anxious behavior and restlessness. Symptoms occur most days.    Depression         This is a chronic problem.  The current episode started more than 1 year ago.   The onset quality is gradual.   The problem occurs intermittently.  The problem has been waxing and waning since onset.  Associated symptoms include restlessness and sad.  Associated symptoms include no helplessness and no hopelessness.  Past medical history includes anxiety.    Pt presents to the office today with bilateral leg swelling. PT was seen in the office on 09/14/16 and had a negative bilateral doppler.  PT states he started back work on 09/21/16 and stands on his feet for 8 hours a day. Pt states the swelling is worse when he stands for long periods of time. Pt states the swelling is "goes down" at night when he elevates his feet.    Review of Systems  Constitutional: Positive for irritability.  Cardiovascular: Positive for leg swelling.  Gastrointestinal: Negative for bowel incontinence.  Genitourinary: Negative for bladder incontinence.  Musculoskeletal: Positive for arthralgias, back pain and neck stiffness.  Psychiatric/Behavioral: Positive for depression. The patient is nervous/anxious.   All other systems reviewed and are negative.      Objective:   Physical Exam  Constitutional: He is oriented to person, place, and time. He appears well-developed and well-nourished. No distress.  HENT:  Head: Normocephalic.  Eyes: Pupils are equal, round, and reactive to light. Right eye exhibits no discharge. Left eye exhibits no discharge.  Neck: Normal range of motion. Neck supple. No thyromegaly present.  Cardiovascular: Normal rate, regular rhythm, normal heart sounds and intact distal pulses.   No murmur heard. Pulmonary/Chest: Effort normal and breath sounds normal. No respiratory distress. He has no wheezes.  Abdominal: Soft. Bowel sounds are normal. He exhibits no distension. There is no tenderness.  Musculoskeletal: He exhibits edema. He exhibits no tenderness.  Low lumbar pain with flexion and extension   Neurological: He is alert and oriented to person, place, and time.  Skin: Skin is warm and dry. No rash noted. No erythema.  Psychiatric: He has a normal mood and affect. His behavior is normal. Judgment and thought content normal.  Vitals reviewed.     BP 121/85   Pulse 94   Temp (!) 97.5 F (36.4 C) (Oral)   Ht _0  (1.778 m)   Wt 200 lb 12.8 oz (91.1 kg)   BMI 28.81 kg/m      Assessment & Plan:  1. GAD (generalized anxiety disorder)  - DULoxetine (CYMBALTA) 60 MG capsule; Take 1 capsule (60 mg total) by mouth daily.  Dispense: 90 capsule; Refill: 1 - CMP14+EGFR  2. Closed nondisplaced fracture of clavicle, unspecified laterality, unspecified part of clavicle, sequela - diclofenac (VOLTAREN) 75 MG EC tablet; Take 1 tablet (75  mg total) by mouth 2 (two) times daily.  Dispense: 60 tablet; Refill: 0 - diclofenac sodium (VOLTAREN) 1 % GEL; Apply 2 g topically 4 (four) times daily.  Dispense: 100 g; Refill: 1 - CMP14+EGFR  3. Lumbar verterbral fracture, traumatic (HCC) - cyclobenzaprine (FLEXERIL) 10 MG tablet; Take 1 tablet (10 mg total) by mouth 3 (three) times daily as needed for muscle spasms.  Dispense: 30 tablet;  Refill: 0 - diclofenac (VOLTAREN) 75 MG EC tablet; Take 1 tablet (75 mg total) by mouth 2 (two) times daily.  Dispense: 60 tablet; Refill: 0 - diclofenac sodium (VOLTAREN) 1 % GEL; Apply 2 g topically 4 (four) times daily.  Dispense: 100 g; Refill: 1 - CMP14+EGFR  4. Closed fracture of facial bone, unspecified facial bone, sequela (HCC) - diclofenac (VOLTAREN) 75 MG EC tablet; Take 1 tablet (75 mg total) by mouth 2 (two) times daily.  Dispense: 60 tablet; Refill: 0 - diclofenac sodium (VOLTAREN) 1 % GEL; Apply 2 g topically 4 (four) times daily.  Dispense: 100 g; Refill: 1 - CMP14+EGFR  5. Depression, major, single episode, moderate (HCC) - DULoxetine (CYMBALTA) 60 MG capsule; Take 1 capsule (60 mg total) by mouth daily.  Dispense: 90 capsule; Refill: 1 - CMP14+EGFR  6. Peripheral edema Keep feet elevated Low salt diet Compression hose - CMP14+EGFR - Compression stockings  Cymbalta increased to 60 mg today from 30 mg  Continue Voltaren 75 mg and gel RTO prn    Evelina Dun, FNP

## 2016-09-26 NOTE — Patient Instructions (Signed)

## 2016-09-27 LAB — CMP14+EGFR
A/G RATIO: 2.2 (ref 1.2–2.2)
ALBUMIN: 4.4 g/dL (ref 3.5–5.5)
ALT: 10 IU/L (ref 0–44)
AST: 16 IU/L (ref 0–40)
Alkaline Phosphatase: 59 IU/L (ref 39–117)
BILIRUBIN TOTAL: 0.2 mg/dL (ref 0.0–1.2)
BUN / CREAT RATIO: 9 (ref 9–20)
BUN: 8 mg/dL (ref 6–20)
CHLORIDE: 108 mmol/L — AB (ref 96–106)
CO2: 23 mmol/L (ref 20–29)
Calcium: 9.5 mg/dL (ref 8.7–10.2)
Creatinine, Ser: 0.89 mg/dL (ref 0.76–1.27)
GFR calc non Af Amer: 108 mL/min/{1.73_m2} (ref >59)
GFR, EST AFRICAN AMERICAN: 125 mL/min/{1.73_m2} (ref >59)
GLOBULIN, TOTAL: 2 g/dL (ref 1.5–4.5)
Glucose: 87 mg/dL (ref 65–99)
POTASSIUM: 4.1 mmol/L (ref 3.5–5.2)
Sodium: 144 mmol/L (ref 134–144)
TOTAL PROTEIN: 6.4 g/dL (ref 6.0–8.5)

## 2016-10-02 ENCOUNTER — Ambulatory Visit: Payer: BLUE CROSS/BLUE SHIELD | Attending: Family | Admitting: *Deleted

## 2016-10-02 DIAGNOSIS — R2681 Unsteadiness on feet: Secondary | ICD-10-CM | POA: Diagnosis present

## 2016-10-02 DIAGNOSIS — M6281 Muscle weakness (generalized): Secondary | ICD-10-CM

## 2016-10-02 NOTE — Therapy (Signed)
[facility] [address] [place], [place], [address] Phone: [phone]   Fax:  [phone]  Physical Therapy Treatment  Patient Details  Name: AARIB PULIDO MRN: [id number] Date of Birth: 05-09-1975 Referring Provider: Jannifer Rodney  Encounter Date: 10/02/2016      PT End of Session - 10/02/16 1453    Visit Number 4   Number of Visits 16   Date for PT Re-Evaluation 11/05/16   PT Start Time 1430   PT Stop Time 1522   PT Time Calculation (min) 52 min   Activity Tolerance Patient tolerated treatment well   Behavior During Therapy [facility] for tasks assessed/performed      No past medical history on file.  No past surgical history on file.  There were no vitals filed for this visit.                       [facility] Adult PT Treatment/Exercise - 10/02/16 0001      Exercises   Exercises Shoulder;Elbow;Knee/Hip     Elbow Exercises   Other elbow exercises 5# bicep curls; hammer curls; tricep kick backs 3 sets to fatigue; Bodyblade lite x 1 minutes.  Green XTS shoulder extension and scapular retraction 3 sets to fatigue.     Knee/Hip Exercises: Aerobic   Nustep level 5 x 20 minutes.     Knee/Hip Exercises: Standing   Rocker Board 5 minutes;1 minute  PF/DF , EV/INV  balance   6 min total     Shoulder Exercises: ROM/Strengthening   UBE (Upper Arm Bike) 8 minutes at 90 RPM's (4 minutes forward and 4 minutes backward).                     PT Long Term Goals - 09/17/16 1225      PT LONG TERM GOAL #1   Title Independent with a HEP.   Time 8   Period Weeks   Status New     PT LONG TERM GOAL #2   Title Restore full left UE AROM.   Time 8   Period Weeks   Status New     PT LONG TERM GOAL #3   Title Normal left U and LE strength.   Time 8   Period Weeks   Status New     PT LONG TERM GOAL #4   Title Walk in clinic 500 feet without assistive device without LOB.   Time 8   Period Weeks   Status New      PT LONG TERM GOAL #5   Title Perform ADL's independently.   Time 8   Period Weeks   Status New               Plan - 10/02/16 1453    Clinical Impression Statement Pt reports that he is able to use LT UE more now with ADLs and at work. He did well with There[employer] for LT UE/LE and balance act.'sBody blade is still the most challenging for LT UE. Continue with progression   Clinical Presentation Stable   Clinical Decision Making Moderate   PT Frequency 2x / week   PT Duration 8 weeks   PT Treatment/Interventions ADLs/Self Care Home Management;Gait training;Functional mobility training;Therapeutic activities;Therapeutic exercise;Balance training;Neuromuscular re-education;Patient/family education;Manual techniques   PT Next Visit Plan Gait and balance training.  Left UE ther ex to include UBE, pulleys and UE ranger.  Left hip strengthening.   Consulted and Agree with Plan of  Care Patient      Patient will benefit from skilled therapeutic intervention in order to improve the following deficits and impairments:  Pain, Decreased activity tolerance, Decreased strength, Decreased mobility, Decreased range of motion, Decreased coordination  Visit Diagnosis: Muscle weakness (generalized)  Unsteadiness on feet     Problem List Patient Active Problem List   Diagnosis Date Noted  . Depression, major, single episode, moderate (HCC) 09/13/2016  . GAD (generalized anxiety disorder) 09/13/2016  . Facial bones, closed fracture (HCC) 09/13/2016  . Dislocated mandible, subsequent encounter 07/16/2016  . Fracture of right clavicle 07/16/2016  . Injury of right vertebral artery 07/16/2016  . L3 vertebral fracture (HCC) 07/16/2016  . L4 vertebral fracture (HCC) 07/16/2016  . Overweight (BMI 25.0-29.9) 03/23/2016  . Current smoker 03/23/2016    RAMSEUR,CHRIS 10/02/2016, 3:22 PM  [facility] [address] [place], [place], [address] Phone:  [phone]   Fax:  [phone]  Name: HARDY HARCUM MRN: [id number] Date of Birth: 24-May-1975

## 2016-10-03 ENCOUNTER — Encounter: Payer: Self-pay | Admitting: Physical Therapy

## 2016-10-03 ENCOUNTER — Ambulatory Visit: Payer: BLUE CROSS/BLUE SHIELD | Admitting: Physical Therapy

## 2016-10-03 DIAGNOSIS — M6281 Muscle weakness (generalized): Secondary | ICD-10-CM

## 2016-10-03 DIAGNOSIS — R2681 Unsteadiness on feet: Secondary | ICD-10-CM

## 2016-10-03 NOTE — Patient Instructions (Signed)
  Strengthening: Resisted Flexion   Hold tubing with left arm at side. Pull forward and up. Move shoulder through pain-free range of motion. Repeat __10__ times per set. Do _2-3___ sets per session. Do _2-3___ sessions per day.  Strengthening: Resisted Extension   Hold tubing in left hand, arm forward. Pull arm back, elbow straight. Repeat __10__ times per set. Do _2-3___ sets per session. Do _2-3___ sessions per day.   Strengthening: Resisted Internal Rotation   Hold tubing in left hand, elbow at side and forearm out. Rotate forearm in across body. Repeat __10__ times per set. Do _2-3___ sets per session. Do _2-3___ sessions per day.   Strengthening: Resisted External Rotation   Hold tubing in left hand, elbow at side and forearm across body. Rotate forearm out. Repeat __10__ times per set. Do __2-3__ sets per session. Do ____ sessions per day.    

## 2016-10-03 NOTE — Therapy (Signed)
[facility] [address] [place], [place], [address] Phone: [phone]   Fax:  [phone]  Physical Therapy Treatment  Patient Details  Name: Vincent Medina MRN: [id number] Date of Birth: 1976-01-01 Referring Provider: Jannifer Rodney  Encounter Date: 10/03/2016      PT End of Session - 10/03/16 0919    Visit Number 5   Number of Visits 16   Date for PT Re-Evaluation 11/05/16   PT Start Time 0900   PT Stop Time 0943   PT Time Calculation (min) 43 min   Activity Tolerance Patient tolerated treatment well   Behavior During Therapy [facility] for tasks assessed/performed      History reviewed. No pertinent past medical history.  History reviewed. No pertinent surgical history.  There were no vitals filed for this visit.      Subjective Assessment - 10/03/16 0902    Subjective Patient reported doing good after last treatment    Pertinent History Patient report:  CVA.   How long can you walk comfortably? Short distances outside with straight cane.   Patient Stated Goals See above.   Currently in Pain? Yes   Pain Score 7    Pain Location Shoulder   Pain Orientation Right;Left   Pain Descriptors / Indicators Aching   Pain Type Acute pain   Pain Onset More than a month ago   Pain Frequency Intermittent   Aggravating Factors  increased activity   Pain Relieving Factors at rest                         [facility] Adult PT Treatment/Exercise - 10/03/16 0001      Knee/Hip Exercises: Aerobic   Nustep level 5-6 x 20 minutes UE/LE     Knee/Hip Exercises: Standing   Other Standing Knee Exercises balance on bosu both sides x24min each side   Other Standing Knee Exercises agility ladder for forward and side to side, attepted jumping yet difficult due to weakness     Knee/Hip Exercises: Supine   Bridges Limitations 2x10     Knee/Hip Exercises: Sidelying   Hip ABduction Strengthening;Left;20 reps     Shoulder Exercises:  Standing   Protraction Strengthening;Left;Theraband  x30   Theraband Level (Shoulder Protraction) Level 2 (Red)   External Rotation Strengthening;Left;Theraband  x30   Theraband Level (Shoulder External Rotation) Level 2 (Red)   Internal Rotation Strengthening;Left;Theraband  x30   Theraband Level (Shoulder Internal Rotation) Level 2 (Red)   Extension Strengthening;Left;Theraband  x30   Theraband Level (Shoulder Extension) Level 2 (Red)   Other Standing Exercises pink XTS  for scap retractions  and lat pull x30 each                PT Education - 10/03/16 0950    Education provided Yes   Education Details HEP Rw4 for left shoulder   Person(s) Educated Patient   Methods Explanation;Demonstration;Handout   Comprehension Verbalized understanding;Returned demonstration             PT Long Term Goals - 10/03/16 0906      PT LONG TERM GOAL #1   Title Independent with a HEP.   Time 8   Period Weeks   Status On-going     PT LONG TERM GOAL #2   Title Restore full left UE AROM.   Time 8   Period Weeks   Status On-going     PT LONG TERM GOAL #3   Title Normal left  U and LE strength.   Time 8   Period Weeks   Status On-going     PT LONG TERM GOAL #4   Title Walk in clinic 500 feet without assistive device without LOB.   Time 8   Period Weeks   Status On-going     PT LONG TERM GOAL #5   Title Perform ADL's independently.   Time 8   Period Weeks   Status On-going               Plan - 10/03/16 1610    Clinical Impression Statement Patient tolerated treatment well today. Patient able to complete most ADL's yet limited with getting in/out of shower and difficulty with work activities. Patient unable to perform overhead activitiy or lifting and climbing any ladders at work at this time. Patient was given HEP for shoulder exercises and was educated on latt pull and retraction with green t-band in addition to red t-band exercises. Patient is not using an  assistive device currently  yet LOB at times. current goals progressing yet ongoing.    Rehab Potential Excellent   PT Frequency 2x / week   PT Duration 8 weeks   PT Treatment/Interventions ADLs/Self Care Home Management;Gait training;Functional mobility training;Therapeutic activities;Therapeutic exercise;Balance training;Neuromuscular re-education;Patient/family education;Manual techniques   PT Next Visit Plan cont with Gait and balance training.  Left UE ther ex to include UBE, pulleys and UE ranger.  Left hip strengthening.   Consulted and Agree with Plan of Care Patient      Patient will benefit from skilled therapeutic intervention in order to improve the following deficits and impairments:  Pain, Decreased activity tolerance, Decreased strength, Decreased mobility, Decreased range of motion, Decreased coordination  Visit Diagnosis: Muscle weakness (generalized)  Unsteadiness on feet     Problem List Patient Active Problem List   Diagnosis Date Noted  . Depression, major, single episode, moderate (HCC) 09/13/2016  . GAD (generalized anxiety disorder) 09/13/2016  . Facial bones, closed fracture (HCC) 09/13/2016  . Dislocated mandible, subsequent encounter 07/16/2016  . Fracture of right clavicle 07/16/2016  . Injury of right vertebral artery 07/16/2016  . L3 vertebral fracture (HCC) 07/16/2016  . L4 vertebral fracture (HCC) 07/16/2016  . Overweight (BMI 25.0-29.9) 03/23/2016  . Current smoker 03/23/2016    Hermelinda Dellen, PTA 10/03/2016, 9:56 AM  [facility] [address] [place], [place], [address] Phone: [phone]   Fax:  [phone]  Name: Vincent Medina MRN: [id number] Date of Birth: 03-18-1975

## 2016-10-09 ENCOUNTER — Ambulatory Visit: Payer: BLUE CROSS/BLUE SHIELD | Admitting: *Deleted

## 2016-10-09 DIAGNOSIS — M6281 Muscle weakness (generalized): Secondary | ICD-10-CM

## 2016-10-09 DIAGNOSIS — R2681 Unsteadiness on feet: Secondary | ICD-10-CM

## 2016-10-09 NOTE — Therapy (Signed)
[facility] [address] [place], [place], [address] Phone: [phone]   Fax:  [phone]  Physical Therapy Treatment  Patient Details  Name: Vincent Medina MRN: [id number] Date of Birth: [birth year] Referring Provider: Jannifer Rodney  Encounter Date: 10/09/2016      PT End of Session - 10/09/16 0922    Visit Number 6   Number of Visits 16   Date for PT Re-Evaluation 11/05/16   PT Start Time 0900   PT Stop Time 0950   PT Time Calculation (min) 50 min      No past medical history on file.  No past surgical history on file.  There were no vitals filed for this visit.      Subjective Assessment - 10/09/16 0916    Subjective Sore after each Rx, but doing ok. I did a 2 mile walk yesterday, but my LT leg was dragging   Pertinent History Patient report:  CVA.   Limitations Walking   How long can you walk comfortably? Short distances outside with straight cane.   Patient Stated Goals See above.   Currently in Pain? Yes   Pain Score 2    Pain Location Shoulder   Pain Descriptors / Indicators Aching   Pain Type Acute pain   Pain Onset More than a month ago   Pain Frequency Intermittent                         [facility] Adult PT Treatment/Exercise - 10/09/16 0001      Elbow Exercises   Other elbow exercises 5# bicep curls; hammer curls; tricep kick backs 3 sets to fatigue; Bodyblade lite x 1 minutes.  Green XTS shoulder extension and scapular retraction 3 sets to fatigue.     Knee/Hip Exercises: Aerobic   Nustep level 5-6 x 20 minutes UE/LE     Knee/Hip Exercises: Standing   SLS SLS on LT  with RT toe touch during UE exs ... RW 4 and bicep curls    Other Standing Knee Exercises balance on inverted  bosu  with 2# ball in LT hand D1 D2 motions     Knee/Hip Exercises: Supine   Bridges Limitations 2x10     Knee/Hip Exercises: Sidelying   Hip ABduction Strengthening;Left;20 reps     Shoulder Exercises: Standing   Protraction Strengthening;Left;Theraband  x30   Theraband Level (Shoulder Protraction) Level 2 (Red)   External Rotation Strengthening;Left;Theraband  x30   Theraband Level (Shoulder External Rotation) Level 2 (Red)   Internal Rotation Strengthening;Left;Theraband  x30   Theraband Level (Shoulder Internal Rotation) Level 2 (Red)   Extension Strengthening;Left;Theraband  x30   Theraband Level (Shoulder Extension) Level 2 (Red)   Other Standing Exercises pink XTS  for scap retractions  and lat pull x30 each     Shoulder Exercises: ROM/Strengthening   UBE (Upper Arm Bike) 6 mins at 90 RPMs standing                     PT Long Term Goals - 10/03/16 0906      PT LONG TERM GOAL #1   Title Independent with a HEP.   Time 8   Period Weeks   Status On-going     PT LONG TERM GOAL #2   Title Restore full left UE AROM.   Time 8   Period Weeks   Status On-going     PT LONG TERM GOAL #3   Title  Normal left U and LE strength.   Time 8   Period Weeks   Status On-going     PT LONG TERM GOAL #4   Title Walk in clinic 500 feet without assistive device without LOB.   Time 8   Period Weeks   Status On-going     PT LONG TERM GOAL #5   Title Perform ADL's independently.   Time 8   Period Weeks   Status On-going               Plan - 10/09/16 0930    Clinical Impression Statement Pt arrived today doing fairly well with minimal pain. He feels that he is progressing fairly well with ADL's but still with balance deficits. Rx focused on challenging his balance during the majority of exs. with SLS on LT and toe touch on RT. Mainly fatigued after RX.   Rehab Potential Excellent   PT Frequency 2x / week   PT Duration 8 weeks   PT Treatment/Interventions ADLs/Self Care Home Management;Gait training;Functional mobility training;Therapeutic activities;Therapeutic exercise;Balance training;Neuromuscular re-education;Patient/family education;Manual techniques   PT Next Visit  Plan cont with Gait and balance training.  Left UE ther ex to include UBE, pulleys and UE ranger.  Left hip strengthening.   Consulted and Agree with Plan of Care Patient      Patient will benefit from skilled therapeutic intervention in order to improve the following deficits and impairments:  Pain, Decreased activity tolerance, Decreased strength, Decreased mobility, Decreased range of motion, Decreased coordination  Visit Diagnosis: Muscle weakness (generalized)  Unsteadiness on feet     Problem List Patient Active Problem List   Diagnosis Date Noted  . Depression, major, single episode, moderate (HCC) 09/13/2016  . GAD (generalized anxiety disorder) 09/13/2016  . Facial bones, closed fracture (HCC) 09/13/2016  . Dislocated mandible, subsequent encounter 07/16/2016  . Fracture of right clavicle 07/16/2016  . Injury of right vertebral artery 07/16/2016  . L3 vertebral fracture (HCC) 07/16/2016  . L4 vertebral fracture (HCC) 07/16/2016  . Overweight (BMI 25.0-29.9) 03/23/2016  . Current smoker 03/23/2016    RAMSEUR,CHRIS, PTA 10/09/2016, 9:58 AM  [facility] [address] [place], [place], [address] Phone: [phone]   Fax:  [phone]  Name: Vincent Medina MRN: [id number] Date of Birth: Nov 01, 1975

## 2016-10-10 ENCOUNTER — Encounter: Payer: Self-pay | Admitting: Physical Therapy

## 2016-10-10 ENCOUNTER — Ambulatory Visit: Payer: BLUE CROSS/BLUE SHIELD | Admitting: Physical Therapy

## 2016-10-10 DIAGNOSIS — R2681 Unsteadiness on feet: Secondary | ICD-10-CM

## 2016-10-10 DIAGNOSIS — M6281 Muscle weakness (generalized): Secondary | ICD-10-CM

## 2016-10-10 NOTE — Therapy (Addendum)
[facility] [place], [place], [address] Phone: [phone]   Fax:  [phone]  Physical Therapy Treatment  Patient Details  Name: Vincent Medina MRN: [id number] Date of Birth: [birth year] Referring Provider: Evelina Dun  Encounter Date: 10/10/2016      PT End of Session - 10/10/16 0928    Visit Number 7   Number of Visits 16   Date for PT Re-Evaluation 11/05/16   PT Start Time 0900   PT Stop Time 0948   PT Time Calculation (min) 48 min   Activity Tolerance Patient tolerated treatment well   Behavior During Therapy [facility] for tasks assessed/performed      History reviewed. No pertinent past medical history.  History reviewed. No pertinent surgical history.  There were no vitals filed for this visit.      Subjective Assessment - 10/10/16 0917    Subjective Patient had no complaints after last treatment   Pertinent History Patient report:  CVA.   Limitations Walking   How long can you walk comfortably? Short distances outside with straight cane.   Currently in Pain? Yes   Pain Score 2    Pain Location --  arms and legs   Pain Orientation Right;Left   Pain Descriptors / Indicators Aching   Pain Type Acute pain   Pain Onset More than a month ago   Pain Frequency Intermittent   Aggravating Factors  increased activity   Pain Relieving Factors rest                         [facility] Adult PT Treatment/Exercise - 10/10/16 0001      Knee/Hip Exercises: Aerobic   Nustep level 5-6 x 20 minutes UE/LE     Knee/Hip Exercises: Standing   Other Standing Knee Exercises balance on BOSU both sides 49mn each   Other Standing Knee Exercises 4" step heel dots 2x10     Shoulder Exercises: Standing   Protraction Strengthening;Left;Theraband  x30   Theraband Level (Shoulder Protraction) Level 2 (Red)   External Rotation Strengthening;Left;Theraband  x30   Theraband Level (Shoulder External Rotation) Level 2  (Red)   Internal Rotation Strengthening;Left;Theraband  x30   Theraband Level (Shoulder Internal Rotation) Level 2 (Red)   Extension Strengthening;Left;Theraband  x30   Theraband Level (Shoulder Extension) Level 2 (Red)   Other Standing Exercises pink XTS  for scap retractions  and lat pull x30 each     Shoulder Exercises: ROM/Strengthening   UBE (Upper Arm Bike) 6 mins at 90 RPMs standing                     PT Long Term Goals - 10/03/16 0906      PT LONG TERM GOAL #1   Title Independent with a HEP.   Time 8   Period Weeks   Status On-going     PT LONG TERM GOAL #2   Title Restore full left UE AROM.   Time 8   Period Weeks   Status On-going     PT LONG TERM GOAL #3   Title Normal left U and LE strength.   Time 8   Period Weeks   Status On-going     PT LONG TERM GOAL #4   Title Walk in clinic 500 feet without assistive device without LOB.   Time 8   Period Weeks   Status On-going     PT LONG TERM GOAL #5  Title Perform ADL's independently.   Time 8   Period Weeks   Status On-going               Plan - 10/10/16 0939    Clinical Impression Statement Patient tolerated treatment well today. Patient reported sore all overtoday and in both UE and LE today. Patient able to progess left LE strengtheing exercises with some difficulty due to left quad control. Patient has some ongoing balamce deficts reported. Goals ongoing.    Rehab Potential Excellent   PT Frequency 2x / week   PT Duration 8 weeks   PT Treatment/Interventions ADLs/Self Care Home Management;Gait training;Functional mobility training;Therapeutic activities;Therapeutic exercise;Balance training;Neuromuscular re-education;Patient/family education;Manual techniques   PT Next Visit Plan cont with Gait and balance training.  Left UE ther ex to include UBE, pulleys and UE ranger.  Left hip strengthening.   Consulted and Agree with Plan of Care Patient      Patient will benefit from  skilled therapeutic intervention in order to improve the following deficits and impairments:  Pain, Decreased activity tolerance, Decreased strength, Decreased mobility, Decreased range of motion, Decreased coordination  Visit Diagnosis: Muscle weakness (generalized)  Unsteadiness on feet     Problem List Patient Active Problem List   Diagnosis Date Noted  . Depression, major, single episode, moderate ([place]) 09/13/2016  . GAD (generalized anxiety disorder) 09/13/2016  . Facial bones, closed fracture ([place]) 09/13/2016  . Dislocated mandible, subsequent encounter 07/16/2016  . Fracture of right clavicle 07/16/2016  . Injury of right vertebral artery 07/16/2016  . L3 vertebral fracture ([place]) 07/16/2016  . L4 vertebral fracture ([place]) 07/16/2016  . Overweight (BMI 25.0-29.9) 03/23/2016  . Current smoker 03/23/2016    Phillips Climes, PTA 10/10/2016, 9:50 AM  [facility] [place], [place], [address] Phone: [phone]   Fax:  [phone]  Name: Vincent Medina MRN: [id number] Date of Birth: [birth year]  PHYSICAL THERAPY DISCHARGE SUMMARY  Visits from Start of Care: 7.  Current functional level related to goals / functional outcomes: See above.   Remaining deficits: See below.   Education / Equipment: HEP. Plan: Patient agrees to discharge.  Patient goals were not met. Patient is being discharged due to not returning since the last visit.  ?????         Mali Applegate MPT

## 2016-10-16 ENCOUNTER — Encounter: Payer: BLUE CROSS/BLUE SHIELD | Admitting: Physical Therapy

## 2016-10-17 ENCOUNTER — Encounter: Payer: BLUE CROSS/BLUE SHIELD | Admitting: Physical Therapy

## 2016-10-24 ENCOUNTER — Ambulatory Visit (INDEPENDENT_AMBULATORY_CARE_PROVIDER_SITE_OTHER): Payer: BLUE CROSS/BLUE SHIELD | Admitting: Family

## 2016-10-24 ENCOUNTER — Encounter: Payer: Self-pay | Admitting: Family

## 2016-10-24 VITALS — BP 136/88 | HR 87 | Temp 97.1°F | Ht 70.0 in | Wt 208.0 lb

## 2016-10-24 DIAGNOSIS — S0292XS Unspecified fracture of facial bones, sequela: Secondary | ICD-10-CM

## 2016-10-24 DIAGNOSIS — M545 Low back pain, unspecified: Secondary | ICD-10-CM

## 2016-10-24 DIAGNOSIS — S42009S Fracture of unspecified part of unspecified clavicle, sequela: Secondary | ICD-10-CM | POA: Diagnosis not present

## 2016-10-24 MED ORDER — OXYCODONE-ACETAMINOPHEN 5-325 MG PO TABS: 1.0000 | ORAL_TABLET | Freq: Two times a day (BID) | ORAL | 0 refills | Status: DC | PRN

## 2016-10-24 MED ORDER — DICLOFENAC SODIUM 1 % TD GEL: 2.0000 g | Freq: Four times a day (QID) | TRANSDERMAL | 1 refills | Status: DC

## 2016-10-24 MED ORDER — CYCLOBENZAPRINE HCL 10 MG PO TABS
10.0000 mg | ORAL_TABLET | Freq: Three times a day (TID) | ORAL | 0 refills | Status: AC | PRN
Start: 2016-10-24 — End: ?
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
## 2016-10-24 NOTE — Patient Instructions (Signed)

## 2016-10-29 LAB — TOXASSURE SELECT 13 (MW), URINE

## 2016-10-30 ENCOUNTER — Other Ambulatory Visit: Payer: Self-pay | Admitting: Family

## 2016-10-30 DIAGNOSIS — R892 Abnormal level of other drugs, medicaments and biological substances in specimens from other organs, systems and tissues: Secondary | ICD-10-CM

## 2016-10-30 DIAGNOSIS — S42001S Fracture of unspecified part of right clavicle, sequela: Secondary | ICD-10-CM

## 2016-10-30 DIAGNOSIS — S0292XS Unspecified fracture of facial bones, sequela: Secondary | ICD-10-CM

## 2017-01-23 ENCOUNTER — Telehealth: Payer: Self-pay | Admitting: Family

## 2017-01-23 DIAGNOSIS — F321 Major depressive disorder, single episode, moderate: Secondary | ICD-10-CM

## 2017-01-23 DIAGNOSIS — F411 Generalized anxiety disorder: Secondary | ICD-10-CM

## 2017-01-23 MED ORDER — DULOXETINE HCL 60 MG PO CPEP: 60.0000 mg | ORAL_CAPSULE | Freq: Every day | ORAL | 1 refills | Status: DC
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
## 2017-01-23 NOTE — Telephone Encounter (Signed)
Prescription sent to pharmacy.

## 2017-01-29 ENCOUNTER — Other Ambulatory Visit: Payer: Self-pay | Admitting: Family

## 2017-01-29 DIAGNOSIS — G4700 Insomnia, unspecified: Secondary | ICD-10-CM

## 2017-01-29 DIAGNOSIS — F321 Major depressive disorder, single episode, moderate: Secondary | ICD-10-CM

## 2017-03-26 ENCOUNTER — Ambulatory Visit (INDEPENDENT_AMBULATORY_CARE_PROVIDER_SITE_OTHER): Payer: 59

## 2017-03-26 ENCOUNTER — Encounter: Payer: Self-pay | Admitting: Family

## 2017-03-26 ENCOUNTER — Ambulatory Visit (INDEPENDENT_AMBULATORY_CARE_PROVIDER_SITE_OTHER): Payer: 59 | Admitting: Family

## 2017-03-26 VITALS — BP 111/76 | HR 90 | Temp 97.7°F | Ht 69.5 in | Wt 217.0 lb

## 2017-03-26 DIAGNOSIS — M545 Low back pain, unspecified: Secondary | ICD-10-CM

## 2017-03-26 DIAGNOSIS — R208 Other disturbances of skin sensation: Secondary | ICD-10-CM

## 2017-03-26 DIAGNOSIS — E669 Obesity, unspecified: Secondary | ICD-10-CM

## 2017-03-26 DIAGNOSIS — R200 Anesthesia of skin: Secondary | ICD-10-CM

## 2017-03-26 DIAGNOSIS — F17200 Nicotine dependence, unspecified, uncomplicated: Secondary | ICD-10-CM

## 2017-03-26 DIAGNOSIS — G8929 Other chronic pain: Secondary | ICD-10-CM

## 2017-03-26 NOTE — Patient Instructions (Signed)
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
## 2017-04-03 ENCOUNTER — Ambulatory Visit (HOSPITAL_COMMUNITY)
Admission: RE | Admit: 2017-04-03 | Discharge: 2017-04-03 | Disposition: A | Discharge status: Home / Self Care | Payer: 59 | Source: Ambulatory Visit | Attending: Family | Admitting: Family

## 2017-04-03 DIAGNOSIS — E669 Obesity, unspecified: Secondary | ICD-10-CM

## 2017-04-03 DIAGNOSIS — R208 Other disturbances of skin sensation: Secondary | ICD-10-CM | POA: Insufficient documentation

## 2017-04-03 DIAGNOSIS — F17200 Nicotine dependence, unspecified, uncomplicated: Secondary | ICD-10-CM

## 2017-04-03 DIAGNOSIS — G8929 Other chronic pain: Secondary | ICD-10-CM | POA: Diagnosis present

## 2017-04-03 DIAGNOSIS — M48061 Spinal stenosis, lumbar region without neurogenic claudication: Secondary | ICD-10-CM | POA: Diagnosis not present

## 2017-04-03 DIAGNOSIS — M5127 Other intervertebral disc displacement, lumbosacral region: Secondary | ICD-10-CM | POA: Insufficient documentation

## 2017-04-03 DIAGNOSIS — Z6830 Body mass index (BMI) 30.0-30.9, adult: Secondary | ICD-10-CM | POA: Diagnosis not present

## 2017-04-03 DIAGNOSIS — R200 Anesthesia of skin: Secondary | ICD-10-CM

## 2017-04-03 DIAGNOSIS — M545 Low back pain: Secondary | ICD-10-CM | POA: Diagnosis not present

## 2017-04-03 DIAGNOSIS — M5126 Other intervertebral disc displacement, lumbar region: Secondary | ICD-10-CM | POA: Diagnosis not present

## 2017-04-04 ENCOUNTER — Other Ambulatory Visit: Payer: Self-pay | Admitting: Family

## 2017-04-16 ENCOUNTER — Telehealth: Payer: Self-pay | Admitting: Family

## 2017-04-16 NOTE — Telephone Encounter (Signed)
Aware of appt  

## 2017-05-21 ENCOUNTER — Encounter: Payer: Self-pay | Admitting: Family

## 2017-05-21 ENCOUNTER — Ambulatory Visit (INDEPENDENT_AMBULATORY_CARE_PROVIDER_SITE_OTHER): Payer: 59 | Admitting: Family

## 2017-05-21 VITALS — BP 120/85 | HR 98 | Temp 98.4°F | Ht 69.5 in | Wt 201.4 lb

## 2017-05-21 DIAGNOSIS — E663 Overweight: Secondary | ICD-10-CM

## 2017-05-21 DIAGNOSIS — F411 Generalized anxiety disorder: Secondary | ICD-10-CM

## 2017-05-21 DIAGNOSIS — F17200 Nicotine dependence, unspecified, uncomplicated: Secondary | ICD-10-CM | POA: Diagnosis not present

## 2017-05-21 DIAGNOSIS — F321 Major depressive disorder, single episode, moderate: Secondary | ICD-10-CM | POA: Diagnosis not present

## 2017-05-21 DIAGNOSIS — G4700 Insomnia, unspecified: Secondary | ICD-10-CM

## 2017-05-21 MED ORDER — MIRTAZAPINE 30 MG PO TABS: 30.0000 mg | ORAL_TABLET | Freq: Every day | ORAL | 1 refills | Status: AC

## 2017-05-21 MED ORDER — DULOXETINE HCL 60 MG PO CPEP: 60.0000 mg | ORAL_CAPSULE | Freq: Every day | ORAL | 1 refills | Status: AC

## 2017-05-21 NOTE — Patient Instructions (Signed)
Living With Depression Everyone experiences occasional disappointment, sadness, and loss in their lives. When you are feeling down, blue, or sad for at least 2 weeks in a row, it may mean that you have depression. Depression can affect your thoughts and feelings, relationships, daily activities, and physical health. It is caused by changes in the way your brain functions. If you receive a diagnosis of depression, your health care provider will tell you which type of depression you have and what treatment options are available to you. If you are living with depression, there are ways to help you recover from it and also ways to prevent it from coming back. How to cope with lifestyle changes Coping with stress Stress is your body's reaction to life changes and events, both good and bad. Stressful situations may include:  Getting married.  The death of a spouse.  Losing a job.  Retiring.  Having a baby.  Stress can last just a few hours or it can be ongoing. Stress can play a major role in depression, so it is important to learn both how to cope with stress and how to think about it differently. Talk with your health care provider or a counselor if you would like to learn more about stress reduction. He or she may suggest some stress reduction techniques, such as:  Music therapy. This can include creating music or listening to music. Choose music that you enjoy and that inspires you.  Mindfulness-based meditation. This kind of meditation can be done while sitting or walking. It involves being aware of your normal breaths, rather than trying to control your breathing.  Centering prayer. This is a kind of meditation that involves focusing on a spiritual word or phrase. Choose a word, phrase, or sacred image that is meaningful to you and that brings you peace.  Deep breathing. To do this, expand your stomach and inhale slowly through your nose. Hold your breath for 3-5 seconds, then exhale  slowly, allowing your stomach muscles to relax.  Muscle relaxation. This involves intentionally tensing muscles then relaxing them.  Choose a stress reduction technique that fits your lifestyle and personality. Stress reduction techniques take time and practice to develop. Set aside 5-15 minutes a day to do them. Therapists can offer training in these techniques. The training may be covered by some insurance plans. Other things you can do to manage stress include:  Keeping a stress diary. This can help you learn what triggers your stress and ways to control your response.  Understanding what your limits are and saying no to requests or events that lead to a schedule that is too full.  Thinking about how you respond to certain situations. You may not be able to control everything, but you can control how you react.  Adding humor to your life by watching funny films or TV shows.  Making time for activities that help you relax and not feeling guilty about spending your time this way.  Medicines Your health care provider may suggest certain medicines if he or she feels that they will help improve your condition. Avoid using alcohol and other substances that may prevent your medicines from working properly (may interact). It is also important to:  Talk with your pharmacist or health care provider about all the medicines that you take, their possible side effects, and what medicines are safe to take together.  Make it your goal to take part in all treatment decisions (shared decision-making). This includes giving input on the side   effects of medicines. It is best if shared decision-making with your health care provider is part of your total treatment plan.  If your health care provider prescribes a medicine, you may not notice the full benefits of it for 4-8 weeks. Most people who are treated for depression need to be on medicine for at least 6-12 months after they feel better. If you are taking  medicines as part of your treatment, do not stop taking medicines without first talking to your health care provider. You may need to have the medicine slowly decreased (tapered) over time to decrease the risk of harmful side effects. Relationships Your health care provider may suggest family therapy along with individual therapy and drug therapy. While there may not be family problems that are causing you to feel depressed, it is still important to make sure your family learns as much as they can about your mental health. Having your family's support can help make your treatment successful. How to recognize changes in your condition Everyone has a different response to treatment for depression. Recovery from major depression happens when you have not had signs of major depression for two months. This may mean that you will start to:  Have more interest in doing activities.  Feel less hopeless than you did 2 months ago.  Have more energy.  Overeat less often, or have better or improving appetite.  Have better concentration.  Your health care provider will work with you to decide the next steps in your recovery. It is also important to recognize when your condition is getting worse. Watch for these signs:  Having fatigue or low energy.  Eating too much or too little.  Sleeping too much or too little.  Feeling restless, agitated, or hopeless.  Having trouble concentrating or making decisions.  Having unexplained physical complaints.  Feeling irritable, angry, or aggressive.  Get help as soon as you or your family members notice these symptoms coming back. How to get support and help from others How to talk with friends and family members about your condition Talking to friends and family members about your condition can provide you with one way to get support and guidance. Reach out to trusted friends or family members, explain your symptoms to them, and let them know that you are  working with a health care provider to treat your depression. Financial resources Not all insurance plans cover mental health care, so it is important to check with your insurance carrier. If paying for co-pays or counseling services is a problem, search for a local or county mental health care center. They may be able to offer public mental health care services at low or no cost when you are not able to see a private health care provider. If you are taking medicine for depression, you may be able to get the generic form, which may be less expensive. Some makers of prescription medicines also offer help to patients who cannot afford the medicines they need. Follow these instructions at home:  Get the right amount and quality of sleep.  Cut down on using caffeine, tobacco, alcohol, and other potentially harmful substances.  Try to exercise, such as walking or lifting small weights.  Take over-the-counter and prescription medicines only as told by your health care provider.  Eat a healthy diet that includes plenty of vegetables, fruits, whole grains, low-fat dairy products, and lean protein. Do not eat a lot of foods that are high in solid fats, added sugars, or salt.    Keep all follow-up visits as told by your health care provider. This is important. Contact a health care provider if:  You stop taking your antidepressant medicines, and you have any of these symptoms: ? Nausea. ? Headache. ? Feeling lightheaded. ? Chills and body aches. ? Not being able to sleep (insomnia).  You or your friends and family think your depression is getting worse. Get help right away if:  You have thoughts of hurting yourself or others. If you ever feel like you may hurt yourself or others, or have thoughts about taking your own life, get help right away. You can go to your nearest emergency department or call:  Your local emergency services (911 in the [place]  A suicide crisis helpline, such as the  National Suicide Prevention Lifeline at 1-800-273-8255. This is open 24-hours a day.  Summary  If you are living with depression, there are ways to help you recover from it and also ways to prevent it from coming back.  Work with your health care team to create a management plan that includes counseling, stress management techniques, and healthy lifestyle habits. This information is not intended to replace advice given to you by your health care provider. Make sure you discuss any questions you have with your health care provider. Document Released: 11/21/2015 Document Revised: 11/21/2015 Document Reviewed: 11/21/2015 Elsevier Interactive Patient Education  2018 [employer]  

## 2017-05-21 NOTE — Progress Notes (Signed)
Subjective:    Patient ID: Vincent Medina, male    DOB: 01/26/1975, 42 y.o.   MRN: [id number]  Chief Complaint  Patient presents with  . discuss back pain and medication    Depression         This is a chronic problem.  The current episode started more than 1 year ago.   The onset quality is gradual.   The problem occurs intermittently.  The problem has been waxing and waning since onset.  Associated symptoms include helplessness, hopelessness, insomnia, irritable, restlessness, decreased interest, appetite change and sad.     The symptoms are aggravated by family issues and medication.  Past treatments include SNRIs - Serotonin and norepinephrine reuptake inhibitors.  Past medical history includes anxiety.   Anxiety  Presents for follow-up visit. Symptoms include depressed mood, excessive worry, insomnia, irritability, nervous/anxious behavior and restlessness. Symptoms occur most days. The severity of symptoms is moderate. The quality of sleep is good.    Insomnia  Primary symptoms: sleep disturbance, difficulty falling asleep.  The current episode started more than one year. The onset quality is gradual. The problem occurs nightly. The problem has been rapidly improving since onset. PMH includes: depression.      Review of Systems  Constitutional: Positive for appetite change and irritability.  Musculoskeletal: Positive for arthralgias and back pain.  Psychiatric/Behavioral: Positive for depression and sleep disturbance. The patient is nervous/anxious and has insomnia.   All other systems reviewed and are negative.      Objective:   Physical Exam  Constitutional: He is oriented to person, place, and time. He appears well-developed and well-nourished. He is irritable. No distress.  HENT:  Head: Normocephalic.  Right Ear: External ear normal.  Left Ear: External ear normal.  Mouth/Throat: Oropharynx is clear and moist.  Eyes: Pupils are equal, round, and reactive to light. Right  eye exhibits no discharge. Left eye exhibits no discharge.  Neck: Normal range of motion. Neck supple. No thyromegaly present.  Cardiovascular: Normal rate, regular rhythm, normal heart sounds and intact distal pulses.  No murmur heard. Pulmonary/Chest: Effort normal and breath sounds normal. No respiratory distress. He has no wheezes.  Abdominal: Soft. Bowel sounds are normal. He exhibits no distension. There is no tenderness.  Musculoskeletal: Normal range of motion. He exhibits no edema or tenderness.  Neurological: He is alert and oriented to person, place, and time. He has normal reflexes. No cranial nerve deficit.  Skin: Skin is warm and dry. No rash noted. No erythema.  Psychiatric: He has a normal mood and affect. His behavior is normal. Judgment and thought content normal.  Vitals reviewed.     BP 120/85   Pulse 98   Temp 98.4 F (36.9 C) (Oral)   Ht 5' 9.5" (1.765 m)   Wt 201 lb 6.4 oz (91.4 kg)   BMI 29.32 kg/m      Assessment & Plan:  Vincent Medina comes in today with chief complaint of discuss back pain and medication   Diagnosis and orders addressed:  1. Depression, major, single episode, moderate (HCC) - DULoxetine (CYMBALTA) 60 MG capsule; Take 1 capsule (60 mg total) by mouth daily.  Dispense: 90 capsule; Refill: 1 - CMP14+EGFR - mirtazapine (REMERON) 30 MG tablet; Take 1 tablet (30 mg total) by mouth at bedtime.  Dispense: 90 tablet; Refill: 1  2. GAD (generalized anxiety disorder) - DULoxetine (CYMBALTA) 60 MG capsule; Take 1 capsule (60 mg total) by mouth daily.  Dispense: 90 capsule;  Refill: 1 - CMP14+EGFR - mirtazapine (REMERON) 30 MG tablet; Take 1 tablet (30 mg total) by mouth at bedtime.  Dispense: 90 tablet; Refill: 1  3. Current smoker Smoking cessation  - CMP14+EGFR  4. Overweight (BMI 25.0-29.9) - CMP14+EGFR  5. Insomnia, unspecified type - mirtazapine (REMERON) 30 MG tablet; Take 1 tablet (30 mg total) by mouth at bedtime.  Dispense: 90  tablet; Refill: 1   Labs pending Health Maintenance reviewed Diet and exercise encouraged  Follow up plan: 6 months    Evelina Dun, FNP

## 2017-05-22 LAB — CMP14+EGFR
A/G RATIO: 2.1 (ref 1.2–2.2)
ALBUMIN: 4.6 g/dL (ref 3.5–5.5)
ALT: 22 IU/L (ref 0–44)
AST: 13 IU/L (ref 0–40)
Alkaline Phosphatase: 90 IU/L (ref 39–117)
BUN / CREAT RATIO: 12 (ref 9–20)
BUN: 12 mg/dL (ref 6–24)
Bilirubin Total: 0.5 mg/dL (ref 0.0–1.2)
CALCIUM: 9.5 mg/dL (ref 8.7–10.2)
CO2: 24 mmol/L (ref 20–29)
CREATININE: 1 mg/dL (ref 0.76–1.27)
Chloride: 107 mmol/L — ABNORMAL HIGH (ref 96–106)
GFR, EST AFRICAN AMERICAN: 108 mL/min/{1.73_m2} (ref >59)
GFR, EST NON AFRICAN AMERICAN: 94 mL/min/{1.73_m2} (ref >59)
GLOBULIN, TOTAL: 2.2 g/dL (ref 1.5–4.5)
Glucose: 97 mg/dL (ref 65–99)
Potassium: 4 mmol/L (ref 3.5–5.2)
SODIUM: 145 mmol/L — AB (ref 134–144)
TOTAL PROTEIN: 6.8 g/dL (ref 6.0–8.5)

## 2017-07-24 ENCOUNTER — Other Ambulatory Visit: Payer: Self-pay | Admitting: Family

## 2017-07-24 DIAGNOSIS — G4700 Insomnia, unspecified: Secondary | ICD-10-CM

## 2017-07-24 DIAGNOSIS — F321 Major depressive disorder, single episode, moderate: Secondary | ICD-10-CM
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
## 2019-04-30 IMAGING — DX DG CHEST 2V
3 series · 3 of 3 positions shown · non-contrast
Comparison: November 18, 2010

CLINICAL DATA: Cough and congestion for several months. Tobacco
use.

EXAM:
CHEST  2 VIEW

[chest pa]
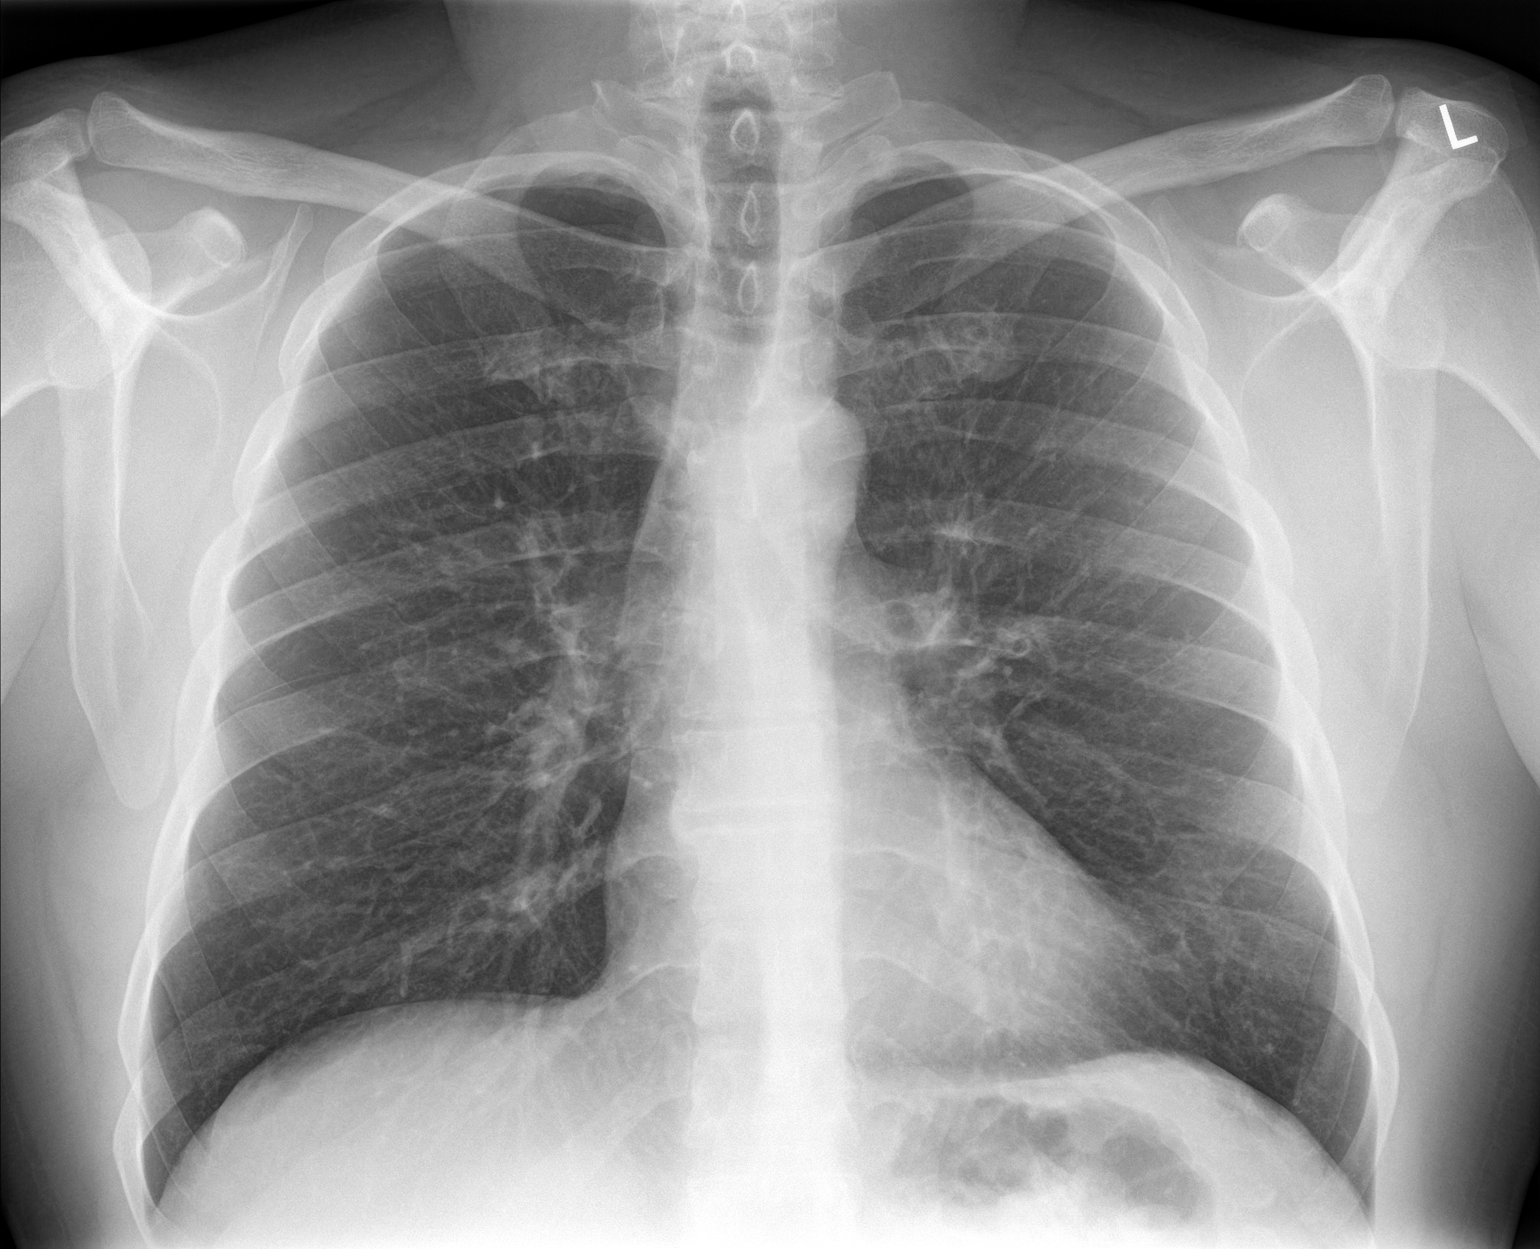

[chest lat (1 of 2)]
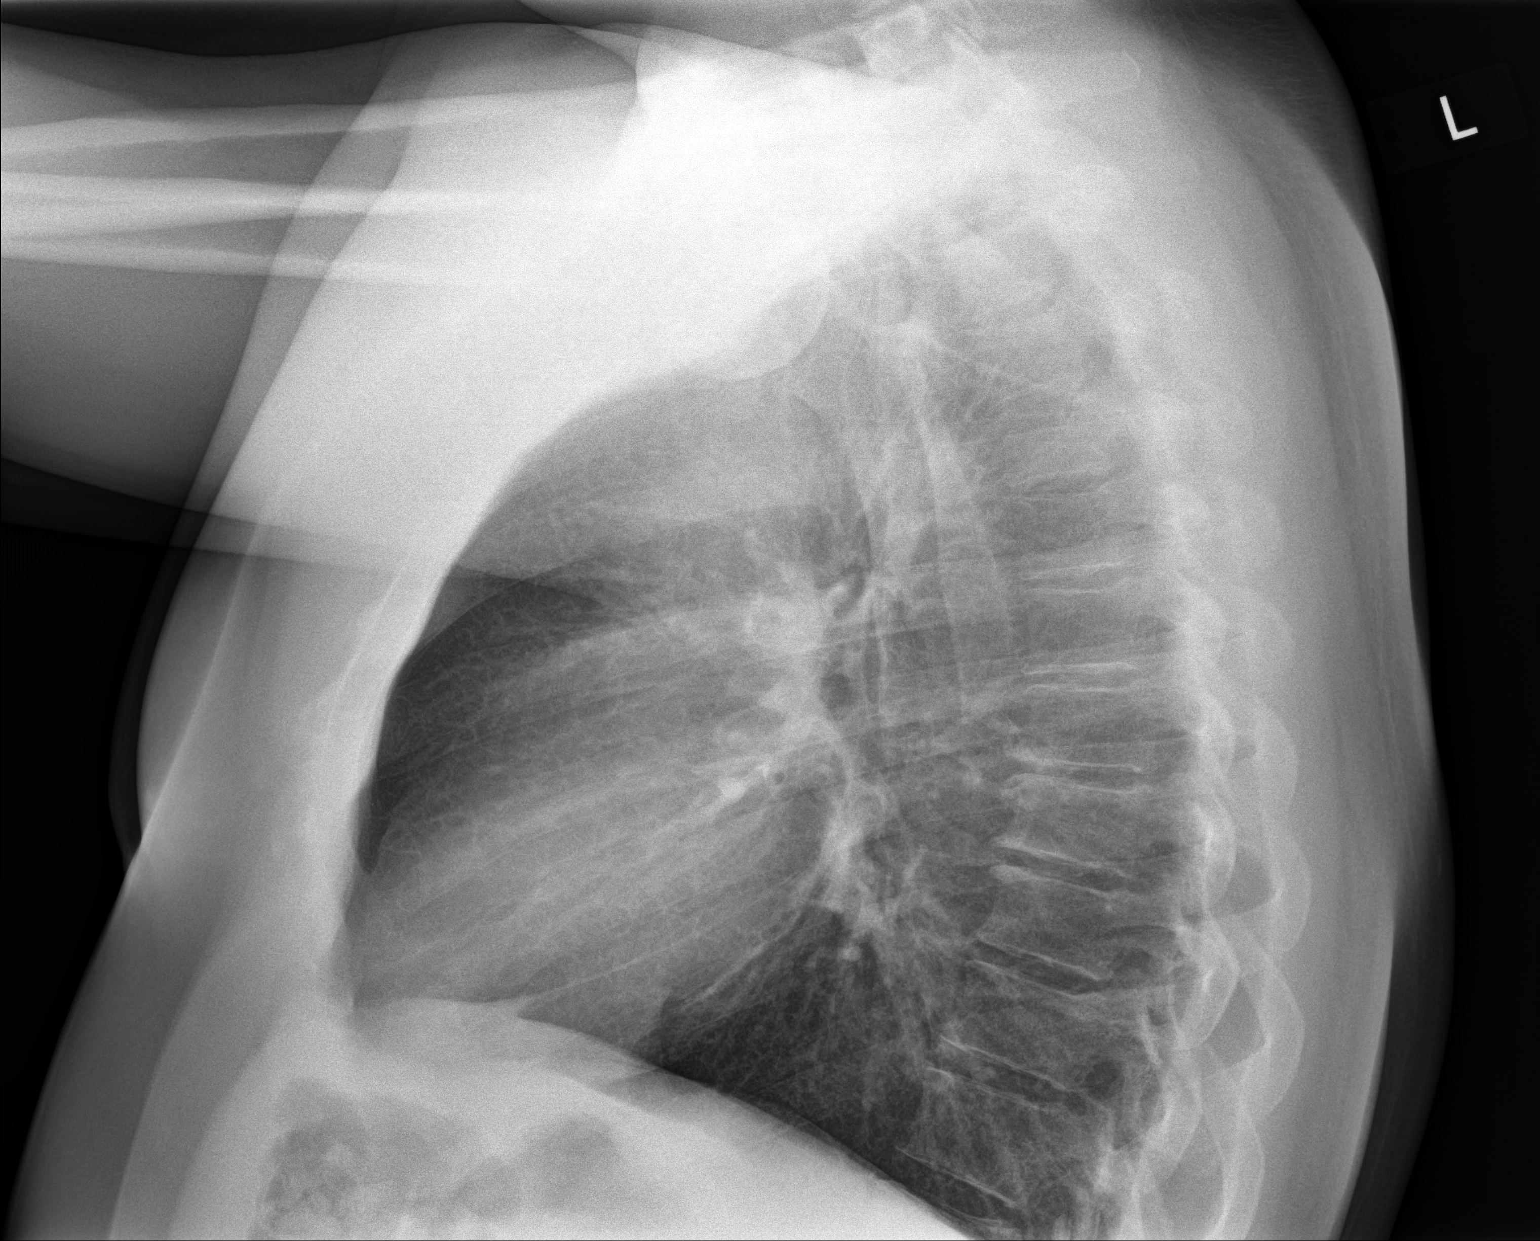

[chest lat (2 of 2)]
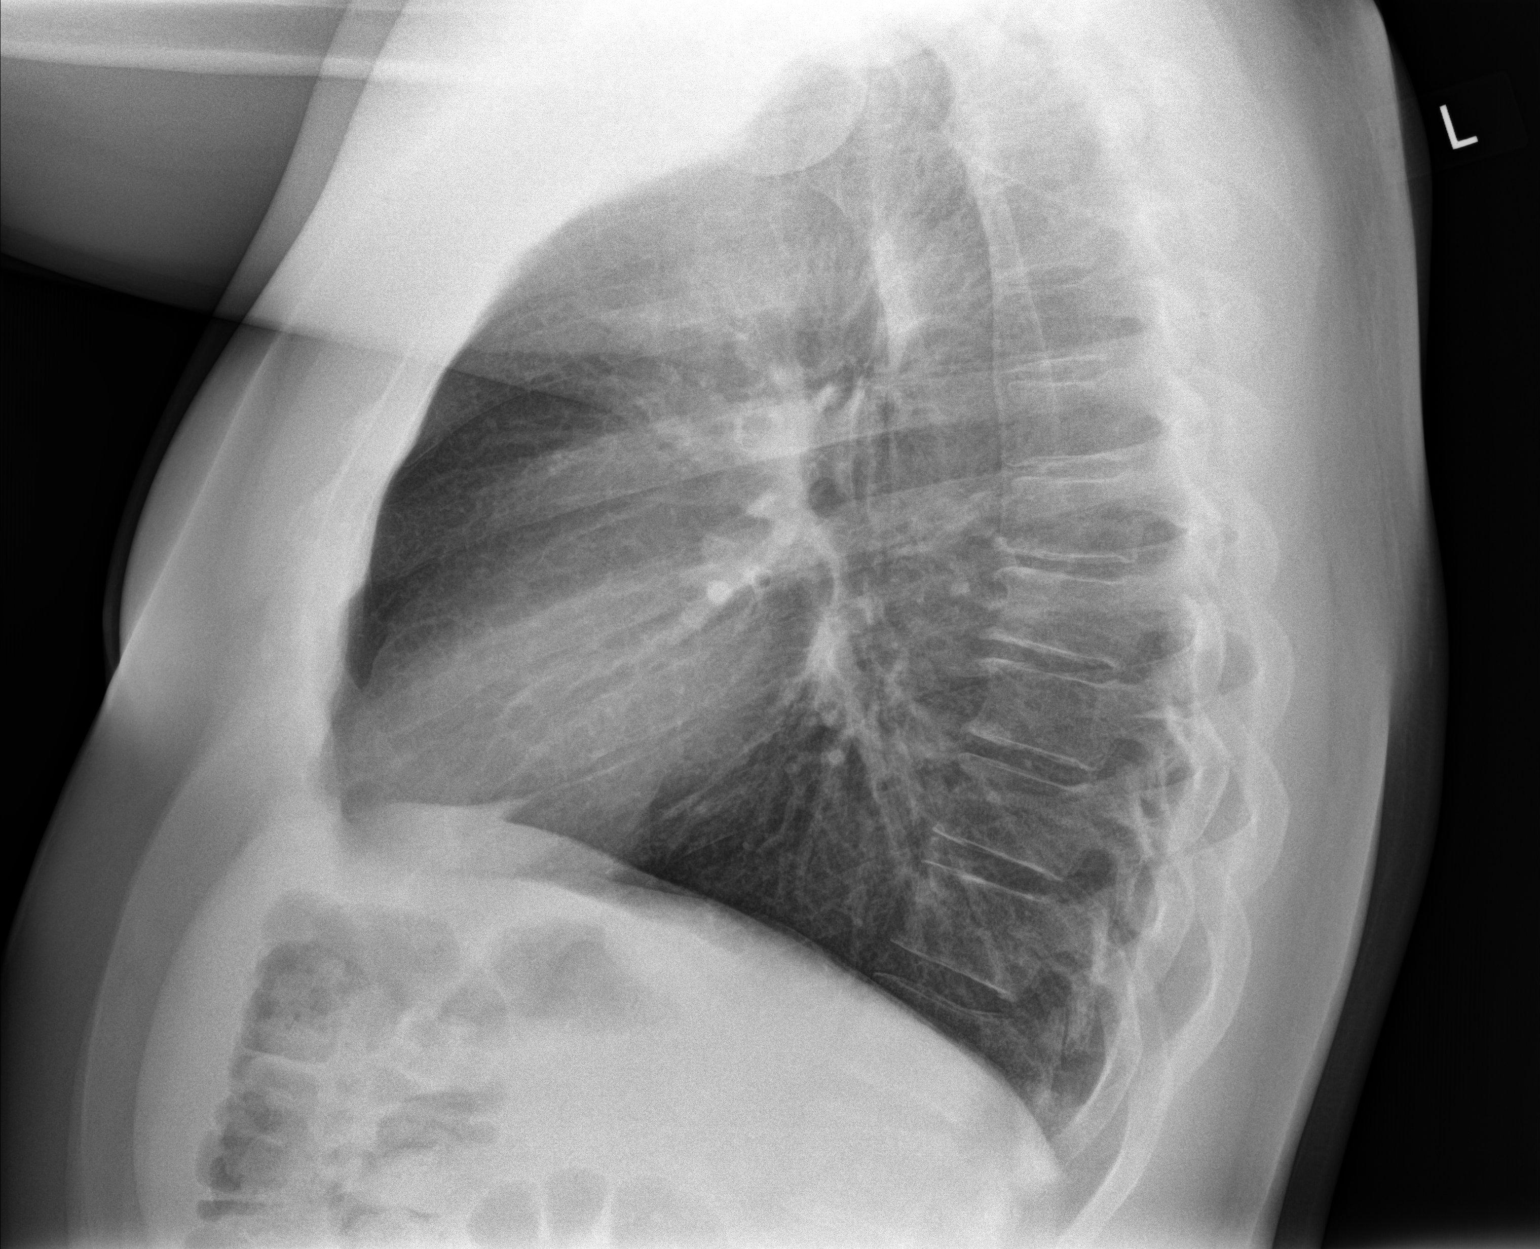

[3 of 3 positions shown; findings below may reference images not displayed]

FINDINGS: There is no edema or consolidation. The heart size and pulmonary
vascularity are normal. No adenopathy. There is slight degenerative
change in the thoracic spine.
IMPRESSION: No edema or consolidation.

## 2019-06-16 ENCOUNTER — Emergency Department (HOSPITAL_COMMUNITY): Payer: Self-pay

## 2019-06-16 ENCOUNTER — Emergency Department (HOSPITAL_COMMUNITY)
Admission: EM | Admit: 2019-06-16 | Discharge: 2019-06-17 | Discharge status: Against Medical Advice | Payer: Self-pay | Attending: Emergency Medicine | Admitting: Emergency Medicine

## 2019-06-16 ENCOUNTER — Encounter (HOSPITAL_COMMUNITY): Payer: Self-pay | Admitting: Emergency Medicine

## 2019-06-16 ENCOUNTER — Other Ambulatory Visit: Payer: Self-pay

## 2019-06-16 DIAGNOSIS — R112 Nausea with vomiting, unspecified: Secondary | ICD-10-CM | POA: Insufficient documentation

## 2019-06-16 DIAGNOSIS — Z91010 Allergy to peanuts: Secondary | ICD-10-CM | POA: Insufficient documentation

## 2019-06-16 DIAGNOSIS — R2689 Other abnormalities of gait and mobility: Secondary | ICD-10-CM | POA: Insufficient documentation

## 2019-06-16 DIAGNOSIS — R531 Weakness: Secondary | ICD-10-CM | POA: Insufficient documentation

## 2019-06-16 DIAGNOSIS — F17200 Nicotine dependence, unspecified, uncomplicated: Secondary | ICD-10-CM | POA: Insufficient documentation

## 2019-06-16 DIAGNOSIS — R200 Anesthesia of skin: Secondary | ICD-10-CM | POA: Insufficient documentation

## 2019-06-16 LAB — DIFFERENTIAL
Abs Immature Granulocytes: 0.04 10*3/uL (ref 0.00–0.07)
Basophils Absolute: 0.1 10*3/uL (ref 0.0–0.1)
Basophils Relative: 1 %
Eosinophils Absolute: 0.4 10*3/uL (ref 0.0–0.5)
Eosinophils Relative: 6 %
Immature Granulocytes: 1 %
Lymphocytes Relative: 27 %
Lymphs Abs: 1.9 10*3/uL (ref 0.7–4.0)
Monocytes Absolute: 0.8 10*3/uL (ref 0.1–1.0)
Monocytes Relative: 11 %
Neutro Abs: 4.1 10*3/uL (ref 1.7–7.7)
Neutrophils Relative %: 54 %

## 2019-06-16 LAB — CBC
HCT: 40.6 % (ref 39.0–52.0)
Hemoglobin: 13.7 g/dL (ref 13.0–17.0)
MCH: 32.2 pg (ref 26.0–34.0)
MCHC: 33.7 g/dL (ref 30.0–36.0)
MCV: 95.5 fL (ref 80.0–100.0)
Platelets: 277 10*3/uL (ref 150–400)
RBC: 4.25 MIL/uL (ref 4.22–5.81)
RDW: 12.9 % (ref 11.5–15.5)
WBC: 7.3 10*3/uL (ref 4.0–10.5)
nRBC: 0 % (ref 0.0–0.2)

## 2019-06-16 LAB — COMPREHENSIVE METABOLIC PANEL
ALT: 25 U/L (ref 0–44)
AST: 23 U/L (ref 15–41)
Albumin: 3.9 g/dL (ref 3.5–5.0)
Alkaline Phosphatase: 52 U/L (ref 38–126)
Anion gap: 9 (ref 5–15)
BUN: 19 mg/dL (ref 6–20)
CO2: 27 mmol/L (ref 22–32)
Calcium: 9.1 mg/dL (ref 8.9–10.3)
Chloride: 101 mmol/L (ref 98–111)
Creatinine, Ser: 0.95 mg/dL (ref 0.61–1.24)
GFR calc Af Amer: >60 mL/min (ref >60)
GFR calc non Af Amer: >60 mL/min (ref >60)
Glucose, Bld: 99 mg/dL (ref 70–99)
Potassium: 3.7 mmol/L (ref 3.5–5.1)
Sodium: 137 mmol/L (ref 135–145)
Total Bilirubin: 0.6 mg/dL (ref 0.3–1.2)
Total Protein: 7.1 g/dL (ref 6.5–8.1)

## 2019-06-16 LAB — CBG MONITORING, ED: Glucose-Capillary: 114 mg/dL — ABNORMAL HIGH (ref 70–99)

## 2019-06-16 LAB — PROTIME-INR
INR: 1 (ref 0.8–1.2)
Prothrombin Time: 12.4 seconds (ref 11.4–15.2)

## 2019-06-16 LAB — APTT: aPTT: 29 seconds (ref 24–36)

## 2019-06-16 NOTE — ED Notes (Signed)
PT told me he took Suboxone and pain medication of a friends today by mouth around 1500 prior to ED arrival for his leg pain.

## 2019-06-16 NOTE — ED Notes (Signed)
CBG 114 

## 2019-06-16 NOTE — ED Notes (Signed)
Pt ambulated to restroom with cane.

## 2019-06-16 NOTE — ED Triage Notes (Signed)
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 Patient had a prior stroke in 2018 with left side deficits. Patient also complains of difficulty with urination. Patient also has redness in his left eye due to fiberglass.

## 2019-06-17 ENCOUNTER — Emergency Department (HOSPITAL_COMMUNITY): Payer: Self-pay

## 2019-06-17 MED ORDER — IOHEXOL 350 MG/ML SOLN: 150.0000 mL | Freq: Once | INTRAVENOUS | Status: DC | PRN

## 2019-06-17 NOTE — ED Notes (Signed)
Pt told staff "im tired of waiting, im leaving." staff member took out pt IV and told pt risks of leaving. Pt seen walking out of emergency dept. EDP made aware.

## 2019-06-17 NOTE — ED Provider Notes (Signed)
[facility] EMERGENCY DEPARTMENT Provider Note   CSN: [id number] Arrival date & time: 06/16/19  1700   Time seen 12:20 AM  History Chief Complaint  Patient presents with  . Dizziness    Vincent Medina is a 44 y.o. male.  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  He states he had a stroke a couple years ago and has left-sided weakness and if he walks a long way he has to use a cane.  He had to call his wife to come help him walk when he left work, he states he had to use the cane and she had a helping.  He states since then he has felt dizzy and he states it is "like drunk".  He denies feeling that things are spinning or feeling that he is going to pass out but states it is a feeling of being off balance.  He also states since then he has swelling of both of his legs.  He states his right arm feels numb from his elbows down to his fingers.  He denies headache, chest pain, he states he does feel short of breath and has wheezing.  He has a cough at times and he sometimes he has nausea and vomiting which started about 4 to 5 days ago.  He states he vomits 2-3 times a day.  He denies abdominal pain except in the suprapubic area.  He states it is hard to urinate and he has to strain but he states it is yellow.  He denies any fever.  He states he was seen at [facility] and treated for heat exhaustion with IV fluids.  He also states his friend gave him Suboxone for his legs hurting.  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  PCP Sharion Balloon, FNP I  Past Medical History:  Diagnosis Date  . Stroke ([place]) 07/16/2016   left sided deficits    Patient Active Problem List   Diagnosis Date Noted  . Abnormal drug screen 10/30/2016  . Depression, major, single episode, moderate ([place]) 09/13/2016  . GAD (generalized anxiety disorder) 09/13/2016  . Facial bones, closed fracture ([place]) 09/13/2016  . Dislocated mandible, subsequent  encounter 07/16/2016  . Fracture of right clavicle 07/16/2016  . Injury of right vertebral artery 07/16/2016  . L3 vertebral fracture ([place]) 07/16/2016  . L4 vertebral fracture ([place]) 07/16/2016  . Overweight (BMI 25.0-29.9) 03/23/2016  . Current smoker 03/23/2016    History reviewed. No pertinent surgical history.     Family History  Problem Relation Age of Onset  . Cancer Mother        lung    Social History   Tobacco Use  . Smoking status: Current Every Day Smoker    Packs/day: 2.00  . Smokeless tobacco: Never Used  Substance Use Topics  . Alcohol use: No  . Drug use: Yes    Types: Opium, Marijuana, Cocaine, "Crack" cocaine, Heroin  lives with spouse Uses a cane  Home Medications Prior to Admission medications   Medication Sig Start Date End Date Taking? Authorizing Provider  cyclobenzaprine (FLEXERIL) 10 MG tablet Take 1 tablet (10 mg total) by mouth 3 (three) times daily as needed for muscle spasms. Patient not taking: Reported on 03/26/2017 10/24/16   Sharion Balloon, FNP  DULoxetine (CYMBALTA) 60 MG capsule Take 1 capsule (60 mg total) by mouth daily. 05/21/17   Evelina Dun  A, FNP  ibuprofen (ADVIL,MOTRIN) 800 MG tablet Take 800 mg by mouth every 8 (eight) hours as needed.    [provider]  mirtazapine (REMERON) 30 MG tablet Take 1 tablet (30 mg total) by mouth at bedtime. 05/21/17   Junie Spencer, FNP  Patient denies taking any medications  Allergies    Bee venom, Hydrocodone, and Peanut oil  Review of Systems   Review of Systems  All other systems reviewed and are negative.   Physical Exam Updated Vital Signs BP (!) 141/98   Pulse 60   Temp 98.1 F (36.7 C) (Oral)   Resp 18   Ht 5\' 10"  (1.778 m)   Wt 104.3 kg   SpO2 96%   BMI 33.00 kg/m   Physical Exam Vitals and nursing note reviewed.  Constitutional:      Appearance: Normal appearance. He is normal weight.  HENT:     Head: Normocephalic and atraumatic.     Right Ear:  External ear normal.     Left Ear: External ear normal.     Nose: Nose normal.  Eyes:     Extraocular Movements: Extraocular movements intact.     Pupils: Pupils are equal, round, and reactive to light.     Comments: Patient has diffuse injection of his left conjunctiva.  Cardiovascular:     Rate and Rhythm: Normal rate and regular rhythm.     Pulses: Normal pulses.  Pulmonary:     Effort: Pulmonary effort is normal. No respiratory distress.     Breath sounds: Normal breath sounds.  Abdominal:     General: Abdomen is flat. Bowel sounds are normal.     Palpations: Abdomen is soft.  Musculoskeletal:        General: Normal range of motion.     Cervical back: Normal range of motion.     Comments: There is no pitting edema noted.  Patient has multiple tattoos over his legs.  Skin:    General: Skin is warm and dry.  Neurological:     General: No focal deficit present.     Mental Status: He is alert and oriented to person, place, and time.     Cranial Nerves: No cranial nerve deficit.     Comments: There is no pronator drift.  Patient's grips are equal bilaterally.  He is able to straight leg raise both legs off the stretcher.  Psychiatric:        Mood and Affect: Mood is anxious.        Speech: Speech is rapid and pressured and slurred.        Behavior: Behavior is cooperative.     ED Results / Procedures / Treatments   Labs (all labs ordered are listed, but only abnormal results are displayed) Results for orders placed or performed during the hospital encounter of 06/16/19  Protime-INR  Result Value Ref Range   Prothrombin Time 12.4 11.4 - 15.2 seconds   INR 1.0 0.8 - 1.2  APTT  Result Value Ref Range   aPTT 29 24 - 36 seconds  CBC  Result Value Ref Range   WBC 7.3 4.0 - 10.5 K/uL   RBC 4.25 4.22 - 5.81 MIL/uL   Hemoglobin 13.7 13.0 - 17.0 g/dL   HCT 06/18/19 39 - 52 %   MCV 95.5 80.0 - 100.0 fL   MCH 32.2 26.0 - 34.0 pg   MCHC 33.7 30.0 - 36.0 g/dL   RDW 94.8 54.6 - 27.0  %  Platelets 277 150 - 400 K/uL   nRBC 0.0 0.0 - 0.2 %  Differential  Result Value Ref Range   Neutrophils Relative % 54 %   Neutro Abs 4.1 1.7 - 7.7 K/uL   Lymphocytes Relative 27 %   Lymphs Abs 1.9 0.7 - 4.0 K/uL   Monocytes Relative 11 %   Monocytes Absolute 0.8 0 - 1 K/uL   Eosinophils Relative 6 %   Eosinophils Absolute 0.4 0 - 0 K/uL   Basophils Relative 1 %   Basophils Absolute 0.1 0 - 0 K/uL   Immature Granulocytes 1 %   Abs Immature Granulocytes 0.04 0.00 - 0.07 K/uL  Comprehensive metabolic panel  Result Value Ref Range   Sodium 137 135 - 145 mmol/L   Potassium 3.7 3.5 - 5.1 mmol/L   Chloride 101 98 - 111 mmol/L   CO2 27 22 - 32 mmol/L   Glucose, Bld 99 70 - 99 mg/dL   BUN 19 6 - 20 mg/dL   Creatinine, Ser 9.02 0.61 - 1.24 mg/dL   Calcium 9.1 8.9 - 40.9 mg/dL   Total Protein 7.1 6.5 - 8.1 g/dL   Albumin 3.9 3.5 - 5.0 g/dL   AST 23 15 - 41 U/L   ALT 25 0 - 44 U/L   Alkaline Phosphatase 52 38 - 126 U/L   Total Bilirubin 0.6 0.3 - 1.2 mg/dL   GFR calc non Af Amer >60 >60 mL/min   GFR calc Af Amer >60 >60 mL/min   Anion gap 9 5 - 15  CBG monitoring, ED  Result Value Ref Range   Glucose-Capillary 114 (H) 70 - 99 mg/dL   Laboratory interpretation all normal except nonfasting hyperglycemia    EKG EKG Interpretation  Date/Time:  Tuesday June 16 2019 17:51:17 EDT Ventricular Rate:  82 PR Interval:  138 QRS Duration: 92 QT Interval:  376 QTC Calculation: 439 R Axis:   37 Text Interpretation: Normal sinus rhythm Normal ECG Electrode noise No old tracing to compare Confirmed by Devoria Albe ([id number]) on 06/16/2019 11:07:29 PM   Radiology CT HEAD WO CONTRAST  Result Date: 06/16/2019 CLINICAL DATA:  Dizziness and numbness in right hand/leg. Stroke 3 years ago with left-sided deficits. EXAM: CT HEAD WITHOUT CONTRAST TECHNIQUE: Contiguous axial images were obtained from the base of the skull through the vertex without intravenous contrast. COMPARISON:  07/02/2016  [facility] FINDINGS: Brain: No mass lesion, hemorrhage, hydrocephalus, acute infarct, intra-axial, or extra-axial fluid collection. Vascular: No hyperdense vessel or unexpected calcification. Skull: Normal Sinuses/Orbits: Normal imaged portions of the orbits and globes. Clear paranasal sinuses and mastoid air cells. Other: None. IMPRESSION: Normal head CT. Electronically Signed   By: Jeronimo Greaves M.D.   On: 06/16/2019 19:24    Procedures Procedures (including critical care time)  Medications Ordered in ED Medications - No data to display  ED Course  I have reviewed the triage vital signs and the nursing notes.  Pertinent labs & imaging results that were available during my care of the patient were reviewed by me and considered in my medical decision making (see chart for details).    MDM Rules/Calculators/A&P                          I do not find any acute evidence of a stroke.  However CTA was done to be sure.  MRI is not available tonight.  This event happened 10 days ago so  patient has not no window for any intervention.  Family had brought him a large bag of food, I heard the nurse tell him he could not eat until after he had the CTA done.  Shortly after that nurses report patient left the ED because "we weren't doing anything for him".  Final Clinical Impression(s) / ED Diagnoses Final diagnoses:  Weakness  Balance problem    Rx / DC Orders  Patient left AMA  Devoria Albe, MD, Concha Pyo, MD 06/17/19 [phone]
